# Patient Record
Sex: Female | Born: 1955 | Race: White | Hispanic: Yes | Marital: Married | Smoking: Former smoker
Health system: Southern US, Community
[De-identification: ages and names within clinical notes are randomized; demographics above are authoritative.]

## PROBLEM LIST (undated history)

## (undated) DIAGNOSIS — T7840XA Allergy, unspecified, initial encounter: Secondary | ICD-10-CM

## (undated) DIAGNOSIS — F32A Depression, unspecified: Secondary | ICD-10-CM

## (undated) DIAGNOSIS — D249 Benign neoplasm of unspecified breast: Secondary | ICD-10-CM

## (undated) DIAGNOSIS — N6452 Nipple discharge: Secondary | ICD-10-CM

## (undated) DIAGNOSIS — J302 Other seasonal allergic rhinitis: Secondary | ICD-10-CM

## (undated) DIAGNOSIS — E785 Hyperlipidemia, unspecified: Secondary | ICD-10-CM

## (undated) DIAGNOSIS — L719 Rosacea, unspecified: Secondary | ICD-10-CM

## (undated) DIAGNOSIS — Z789 Other specified health status: Secondary | ICD-10-CM

## (undated) DIAGNOSIS — M1990 Unspecified osteoarthritis, unspecified site: Secondary | ICD-10-CM

## (undated) DIAGNOSIS — N3941 Urge incontinence: Secondary | ICD-10-CM

## (undated) HISTORY — PX: TONSILLECTOMY: SHX5217

## (undated) HISTORY — PX: EYE SURGERY: SHX253

## (undated) HISTORY — DX: Rosacea, unspecified: L71.9

## (undated) HISTORY — PX: BREAST EXCISIONAL BIOPSY: SUR124

## (undated) HISTORY — DX: Hyperlipidemia, unspecified: E78.5

## (undated) HISTORY — DX: Unspecified osteoarthritis, unspecified site: M19.90

## (undated) HISTORY — DX: Urge incontinence: N39.41

## (undated) HISTORY — DX: Benign neoplasm of unspecified breast: D24.9

## (undated) HISTORY — DX: Nipple discharge: N64.52

## (undated) HISTORY — DX: Allergy, unspecified, initial encounter: T78.40XA

## (undated) HISTORY — DX: Depression, unspecified: F32.A

---
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
## 1985-05-22 HISTORY — PX: ARCUATE KERATECTOMY: SHX212

## 1991-05-23 HISTORY — PX: ABDOMINAL HYSTERECTOMY: SHX81

## 1992-05-22 HISTORY — PX: ABDOMINAL HYSTERECTOMY: SHX81

## 2004-05-22 HISTORY — PX: OTHER SURGICAL HISTORY: SHX169

## 2004-12-30 ENCOUNTER — Encounter: Admission: RE | Admit: 2004-12-30 | Discharge: 2004-12-30 | Payer: Self-pay | Admitting: Obstetrics and Gynecology

## 2005-05-03 ENCOUNTER — Ambulatory Visit: Payer: Self-pay | Admitting: Internal Medicine

## 2006-03-02 ENCOUNTER — Encounter: Payer: Self-pay | Admitting: Internal Medicine

## 2006-03-02 ENCOUNTER — Ambulatory Visit: Payer: Self-pay | Admitting: Internal Medicine

## 2006-11-28 ENCOUNTER — Other Ambulatory Visit: Admission: RE | Admit: 2006-11-28 | Discharge: 2006-11-28 | Payer: Self-pay | Admitting: Obstetrics and Gynecology

## 2006-12-12 ENCOUNTER — Encounter: Admission: RE | Admit: 2006-12-12 | Discharge: 2006-12-12 | Payer: Self-pay | Admitting: Obstetrics and Gynecology

## 2007-05-06 ENCOUNTER — Ambulatory Visit: Payer: Self-pay | Admitting: Internal Medicine

## 2007-05-07 ENCOUNTER — Telehealth: Payer: Self-pay | Admitting: Internal Medicine

## 2007-05-07 ENCOUNTER — Encounter: Payer: Self-pay | Admitting: Internal Medicine

## 2008-02-12 ENCOUNTER — Encounter: Admission: RE | Admit: 2008-02-12 | Discharge: 2008-02-12 | Payer: Self-pay | Admitting: Obstetrics and Gynecology

## 2008-02-20 ENCOUNTER — Encounter: Admission: RE | Admit: 2008-02-20 | Discharge: 2008-02-20 | Payer: Self-pay | Admitting: Obstetrics and Gynecology

## 2008-02-20 ENCOUNTER — Encounter (INDEPENDENT_AMBULATORY_CARE_PROVIDER_SITE_OTHER): Payer: Self-pay | Admitting: Diagnostic Radiology

## 2008-11-04 ENCOUNTER — Encounter: Admission: RE | Admit: 2008-11-04 | Discharge: 2008-11-04 | Payer: Self-pay | Admitting: Obstetrics and Gynecology

## 2009-03-02 ENCOUNTER — Ambulatory Visit: Payer: Self-pay | Admitting: Family Medicine

## 2009-03-05 ENCOUNTER — Telehealth: Payer: Self-pay | Admitting: Family Medicine

## 2009-03-05 LAB — CONVERTED CEMR LAB
AST: 24 units/L (ref 0–37)
Albumin: 3.8 g/dL (ref 3.5–5.2)
Alkaline Phosphatase: 68 units/L (ref 39–117)
Basophils Absolute: 0 10*3/uL (ref 0.0–0.1)
Basophils Relative: 0.2 % (ref 0.0–3.0)
Calcium: 9.2 mg/dL (ref 8.4–10.5)
GFR calc non Af Amer: 79.57 mL/min (ref >60)
Hemoglobin: 12.8 g/dL (ref 12.0–15.0)
Lymphocytes Relative: 31.6 % (ref 12.0–46.0)
Monocytes Relative: 2.3 % — ABNORMAL LOW (ref 3.0–12.0)
Neutro Abs: 5.3 10*3/uL (ref 1.4–7.7)
RBC: 4.12 M/uL (ref 3.87–5.11)
Sodium: 140 meq/L (ref 135–145)
Total Protein: 6.6 g/dL (ref 6.0–8.3)
WBC: 8.2 10*3/uL (ref 4.5–10.5)

## 2009-03-22 ENCOUNTER — Other Ambulatory Visit: Admission: RE | Admit: 2009-03-22 | Discharge: 2009-03-22 | Payer: Self-pay | Admitting: Obstetrics and Gynecology

## 2009-09-28 ENCOUNTER — Ambulatory Visit: Payer: Self-pay | Admitting: Internal Medicine

## 2009-12-08 ENCOUNTER — Encounter: Admission: RE | Admit: 2009-12-08 | Discharge: 2009-12-08 | Payer: Self-pay | Admitting: Obstetrics and Gynecology

## 2009-12-23 LAB — HM MAMMOGRAPHY

## 2010-03-10 ENCOUNTER — Ambulatory Visit: Payer: Self-pay | Admitting: Family Medicine

## 2010-03-10 DIAGNOSIS — L259 Unspecified contact dermatitis, unspecified cause: Secondary | ICD-10-CM

## 2010-04-13 ENCOUNTER — Ambulatory Visit: Payer: Self-pay | Admitting: Internal Medicine

## 2010-04-13 LAB — CONVERTED CEMR LAB
Basophils Relative: 0.4 % (ref 0.0–3.0)
Eosinophils Relative: 2 % (ref 0.0–5.0)
Lymphocytes Relative: 42.3 % (ref 12.0–46.0)
MCV: 91.7 fL (ref 78.0–100.0)
Monocytes Relative: 6.5 % (ref 3.0–12.0)
Neutrophils Relative %: 48.8 % (ref 43.0–77.0)
RBC: 4.24 M/uL (ref 3.87–5.11)
WBC: 5.8 10*3/uL (ref 4.5–10.5)

## 2010-04-19 LAB — CONVERTED CEMR LAB
AST: 20 units/L (ref 0–37)
Bilirubin, Direct: 0.1 mg/dL (ref 0.0–0.3)
CO2: 22 meq/L (ref 19–32)
Calcium: 9 mg/dL (ref 8.4–10.5)
Chloride: 105 meq/L (ref 96–112)
FSH: 4.5 milliintl units/mL
Glucose, Bld: 91 mg/dL (ref 70–99)
LDL Cholesterol: 183 mg/dL — ABNORMAL HIGH (ref 0–99)
LH: 11.7 milliintl units/mL
Sodium: 138 meq/L (ref 135–145)
TSH: 1.774 microintl units/mL (ref 0.350–4.500)
Total Bilirubin: 0.9 mg/dL (ref 0.3–1.2)
Total CHOL/HDL Ratio: 5.5

## 2010-05-10 ENCOUNTER — Telehealth: Payer: Self-pay | Admitting: *Deleted

## 2010-05-26 ENCOUNTER — Encounter (INDEPENDENT_AMBULATORY_CARE_PROVIDER_SITE_OTHER): Payer: Self-pay | Admitting: *Deleted

## 2010-06-12 ENCOUNTER — Encounter: Payer: Self-pay | Admitting: Obstetrics and Gynecology

## 2010-06-21 NOTE — Assessment & Plan Note (Signed)
Summary: EAR DISCOMFORT // RS   Vital Signs:  Patient profile:   55 year old female Weight:      190 pounds Temp:     99.6 degrees F oral BP sitting:   120 / 70  (left arm) Cuff size:   regular  Vitals Entered By: Sid Falcon LPN (March 10, 2010 4:08 PM)  History of Present Illness: Bilateral ear pruritus right greater than left for several months. Occasional clear drainage. No associated pain. No purulent drainage. No hearing changes. Has not tried anything topically.  No other rashes.  Allergies: 1)  ! Codeine 2)  ! Sulfa  Past History:  Past Medical History: Last updated: 05/06/2007 Unremarkable  Physical Exam  General:  Well-developed,well-nourished,in no acute distress; alert,appropriate and cooperative throughout examination Ears:  chest some nonspecific dryness and mild erythema mild scaling mostly inferior portion and anterior portion of the ear canal right greater than left. Eardrums appear normal. No evidence for otitis externa Lungs:  Normal respiratory effort, chest expands symmetrically. Lungs are clear to auscultation, no crackles or wheezes. Heart:  Normal rate and regular rhythm. S1 and S2 normal without gallop, murmur, click, rub or other extra sounds. Cervical Nodes:  No lymphadenopathy noted   Impression & Recommendations:  Problem # 1:  ECZEMA (ICD-692.9) Assessment New avoid scratching as much as possible. Her updated medication list for this problem includes:    Elocon 0.1 % Lotn (Mometasone furoate) .Marland Kitchen... Apply to affected rash once daily as needed  Complete Medication List: 1)  Detrol La 2 Mg Cp24 (Tolterodine tartrate) .... Take 1 tablet by mouth once a day 2)  Caltrate 600+d 600-400 Mg-unit Tabs (Calcium carbonate-vitamin d) .... Two times a day 3)  Vitamin C 1000 Mg Tabs (Ascorbic acid) .... Once daily 4)  Aspirin 81 Mg Tabs (Aspirin) .... Once daily 5)  Elocon 0.1 % Lotn (Mometasone furoate) .... Apply to affected rash once daily as  needed  Patient Instructions: 1)  Avoid scratching as much as possible. 2)  Touch base in 2 weeks with primary physician if not improved. Prescriptions: ELOCON 0.1 % LOTN (MOMETASONE FUROATE) apply to affected rash once daily as needed  #1 bottle x 1   Entered and Authorized by:   Evelena Peat MD   Signed by:   Evelena Peat MD on 03/10/2010   Method used:   Electronically to        [id number] (retail)       [address]       [place], [place]  [address]       Ph: [phone] or [phone]       Fax: [phone]   RxID:   [id number]    Orders Added: 1)  Est. Patient Level III [42706]

## 2010-06-21 NOTE — Assessment & Plan Note (Signed)
Summary: cpx----will fast//ccm   Vital Signs:  Patient profile:   55 year old female Height:      66.5 inches Weight:      186 pounds BMI:     29.68 Temp:     99.1 degrees F oral Pulse rate:   84 / minute Pulse rhythm:   regular BP sitting:   114 / 66  (left arm) Cuff size:   large  Vitals Entered By: Alfred Levins, CMA (April 13, 2010 9:08 AM) CC: cpx, fasting, no pap   CC:  cpx, fasting, and no pap.  History of Present Illness: CPX  Preventive Screening-Counseling & Management  Alcohol-Tobacco     Smoking Status: quit  Current Problems (verified): 1)  Health Screening  (ICD-V70.0) 2)  Eczema  (ICD-692.9)  Current Medications (verified): 1)  Detrol La 2 Mg  Cp24 (Tolterodine Tartrate) .... Take 1 Tablet By Mouth Once A Day 2)  Caltrate 600+d 600-400 Mg-Unit  Tabs (Calcium Carbonate-Vitamin D) .... Two Times A Day 3)  Vitamin C 1000 Mg Tabs (Ascorbic Acid) .... Once Daily 4)  Aspirin 81 Mg Tabs (Aspirin) .... Once Daily  Allergies (verified): 1)  ! Codeine 2)  ! Sulfa  Past History:  Past Medical History: g4P3  Family History: mother htn father deceased COPD, MI, AAA---76 yo  Social History: Runner, broadcasting/film/video kindergartin married Former Smoker quit 1990 alcohol---rare 3 kids  Physical Exam  General:  well-developed well-nourished female in no acute distress. HEENT exam atraumatic, normocephalic symmetric her muscles are intact. Neck is supple without lymphadenopathy or thyromegaly. Chest clear to auscultation. Cardiac exam S1-S2 are regular. Abdominal exam at present, soft, nontender, overweight. It is no clubbing cyanosis or edema. Neurologic exam alert and oriented without motor or sensory deicits   Impression & Recommendations:  Problem # 1:  HEALTH SCREENING (ICD-V70.0) health maintenance up-to-date. She has a colonoscopy screening. We will order that. I've advised her to consider weight loss. I've advised her to exercise  regularly. Orders: Venipuncture (18841) TLB-Lipid Panel (80061-LIPID) TLB-BMP (Basic Metabolic Panel-BMET) (80048-METABOL) TLB-CBC Platelet - w/Differential (85025-CBCD) TLB-Hepatic/Liver Function Pnl (80076-HEPATIC) TLB-TSH (Thyroid Stimulating Hormone) (84443-TSH) UA Dipstick w/o Micro (manual) (66063) Gastroenterology Referral (GI)  Complete Medication List: 1)  Detrol La 2 Mg Cp24 (Tolterodine tartrate) .... Take 1 tablet by mouth once a day 2)  Caltrate 600+d 600-400 Mg-unit Tabs (Calcium carbonate-vitamin d) .... Two times a day 3)  Vitamin C 1000 Mg Tabs (Ascorbic acid) .... Once daily 4)  Aspirin 81 Mg Tabs (Aspirin) .... Once daily  Other Orders: TLB-FSH (Follicle Stimulating Hormone) (83001-FSH) TLB-Luteinizing Hormone [facility]) (83002-LH)  Preventive Care Screening  Mammogram:    Date:  12/20/2009    Next Due:  12/2010    Results:  normal   Pap Smear:    Date:  12/20/2009    Next Due:  12/2010    Results:  normal   Last Tetanus Booster:    Date:  05/22/2005    Results:  Tdap     Orders Added: 1)  Venipuncture [36415] 2)  TLB-Lipid Panel [80061-LIPID] 3)  TLB-BMP (Basic Metabolic Panel-BMET) [80048-METABOL] 4)  TLB-CBC Platelet - w/Differential [85025-CBCD] 5)  TLB-Hepatic/Liver Function Pnl [80076-HEPATIC] 6)  TLB-TSH (Thyroid Stimulating Hormone) [84443-TSH] 7)  UA Dipstick w/o Micro (manual) [81002] 8)  Est. Patient 40-64 years [99396] 9)  Gastroenterology Referral [GI] 10)  TLB-FSH (Follicle Stimulating Hormone) [83001-FSH] 11)  TLB-Luteinizing Hormone [facility]) [83002-LH]    Preventive Care Screening  Mammogram:    Date:  12/20/2009  Next Due:  12/2010    Results:  normal   Pap Smear:    Date:  12/20/2009    Next Due:  12/2010    Results:  normal   Last Tetanus Booster:    Date:  05/22/2005    Results:  Tdap    Appended Document: Orders Update     Clinical Lists Changes  Orders: Added new Service order of Specimen Handling (16109)  - Signed Added new Test order of T- * Misc. Laboratory test [phone]) - Signed Observations: Added new observation of COMMENTS: Wynona Canes, CMA  April 13, 2010 1:36 PM  (04/13/2010 9:49) Added new observation of PH URINE: 7.0  (04/13/2010 9:49) Added new observation of SPEC GR URIN: 1.015  (04/13/2010 9:49) Added new observation of APPEARANCE U: Clear  (04/13/2010 9:49) Added new observation of UA COLOR: yellow  (04/13/2010 9:49) Added new observation of WBC DIPSTK U: negative  (04/13/2010 9:49) Added new observation of NITRITE URN: negative  (04/13/2010 9:49) Added new observation of UROBILINOGEN: 0.2  (04/13/2010 9:49) Added new observation of PROTEIN, URN: negative  (04/13/2010 9:49) Added new observation of BLOOD UR DIP: negative  (04/13/2010 9:49) Added new observation of KETONES URN: negative  (04/13/2010 9:49) Added new observation of BILIRUBIN UR: negative  (04/13/2010 9:49) Added new observation of GLUCOSE, URN: negative  (04/13/2010 9:49)      Laboratory Results   Urine Tests  Date/Time Recieved: April 13, 2010 1:36 PM  Date/Time Reported: April 13, 2010 1:36 PM   Routine Urinalysis   Color: yellow Appearance: Clear Glucose: negative   (Normal Range: Negative) Bilirubin: negative   (Normal Range: Negative) Ketone: negative   (Normal Range: Negative) Spec. Gravity: 1.015   (Normal Range: 1.003-1.035) Blood: negative   (Normal Range: Negative) pH: 7.0   (Normal Range: 5.0-8.0) Protein: negative   (Normal Range: Negative) Urobilinogen: 0.2   (Normal Range: 0-1) Nitrite: negative   (Normal Range: Negative) Leukocyte Esterace: negative   (Normal Range: Negative)    Comments: Wynona Canes, CMA  April 13, 2010 1:36 PM

## 2010-06-21 NOTE — Assessment & Plan Note (Signed)
Summary: swollen ankle/rcd   Vital Signs:  Patient profile:   55 year old female Pulse rate:   66 / minute Pulse rhythm:   regular Resp:     12 per minute BP sitting:   110 / 64  (left arm) Cuff size:   regular  Vitals Entered By: Gladis Riffle, RN (Sep 28, 2009 8:59 AM) CC: twisted right ankle last evening--now hurts to walk on that foot--has taken advil and used ice and elevation Is Patient Diabetic? No   CC:  twisted right ankle last evening--now hurts to walk on that foot--has taken advil and used ice and elevation.  History of Present Illness: yesterday twisted ankle internal rotation some pain some swelling put ice and elevated ankle last night.  right ankle  Still has some pain this a.m. with walking  All other systems reviewed and were negative   Preventive Screening-Counseling & Management  Alcohol-Tobacco     Smoking Status: quit  Current Problems (verified): None  Current Medications (verified): 1)  Detrol La 2 Mg  Cp24 (Tolterodine Tartrate) .... Take 1 Tablet By Mouth Once A Day 2)  Caltrate 600+d 600-400 Mg-Unit  Tabs (Calcium Carbonate-Vitamin D) .... Two Times A Day 3)  Vitamin C 1000 Mg Tabs (Ascorbic Acid) .... Once Daily  Allergies: 1)  ! Codeine 2)  ! Sulfa  Physical Exam  General:  Well-developed,well-nourished,in no acute distress; alert,appropriate and cooperative throughout examination Neck:  No deformities, masses, or tenderness noted. Msk:  favors r leg with walking  FROM both ankles, minimal swelling around lateral maleolus right ankle   Impression & Recommendations:  Problem # 1:  ANKLE PAIN (ICD-719.47) acute trauma needs imaging if no fracture then ice and elevation should suffice xray  Orders: T-Ankle Comp Right (73610TC)  Complete Medication List: 1)  Detrol La 2 Mg Cp24 (Tolterodine tartrate) .... Take 1 tablet by mouth once a day 2)  Caltrate 600+d 600-400 Mg-unit Tabs (Calcium carbonate-vitamin d) .... Two times a  day 3)  Vitamin C 1000 Mg Tabs (Ascorbic acid) .... Once daily

## 2010-06-23 NOTE — Letter (Signed)
Summary: Referral - not able to see patient  [facility]  [address] [place], [place] [address]   Phone: [phone]  Fax: [phone]    May 26, 2010   Birdie Sons, [id number]VZ[address] [place], [place] [address]    Re:   Samantha Mayer DOB:  Apr 21, 1956 MRN:   [id number]    Dear Dr. Cato Mulligan:  Thank you for your kind referral of the above patient.  We have attempted to schedule the recommended procedure Screening Colonoscopy but have not been able to schedule because:  ___ The patient was not available by phone and/or has not returned our calls.  X   The patient declined to schedule the procedure at this time.  We appreciate the referral and hope that we will have the opportunity to treat this patient in the future.    Sincerely,    [facility] [phone]

## 2010-06-23 NOTE — Progress Notes (Signed)
Summary: dx question  Phone Note Call from Patient Call back at [phone]   Caller: Patient---triage vm Summary of Call: needing the correct dx. please return her call. Initial call taken by: Warnell Forester,  May 10, 2010 10:49 AM  Follow-up for Phone Call        Correct dx for what? Follow-up by: Trixie Dredge,  May 10, 2010 12:20 PM  Additional Follow-up for Phone Call Additional follow up Details #1::        wrong number Additional Follow-up by: Alfred Levins, CMA,  May 12, 2010 2:59 PM    Additional Follow-up for Phone Call Additional follow up Details #2::    This message was left on triage vm. pt says that she received 2 different dxs about a test or lab. two different calls were made to her. Follow-up by: Warnell Forester,  May 11, 2010 10:16 AM

## 2010-07-29 ENCOUNTER — Telehealth: Payer: Self-pay | Admitting: Internal Medicine

## 2010-07-29 NOTE — Telephone Encounter (Signed)
Triage vm-------please mail her test results from November 2011.

## 2010-07-29 NOTE — Telephone Encounter (Signed)
Labs mailed

## 2010-08-04 ENCOUNTER — Telehealth: Payer: Self-pay | Admitting: Internal Medicine

## 2010-08-04 NOTE — Telephone Encounter (Signed)
Pt called and said that she still has not rcvd the lab results from 03/2010. Pt is aware that results were originally mailed out on 07/29/10, but still not rcvd. Pls resend.

## 2010-08-05 NOTE — Telephone Encounter (Signed)
Mailed labs again

## 2010-10-24 ENCOUNTER — Encounter: Payer: Self-pay | Admitting: Family Medicine

## 2010-10-24 ENCOUNTER — Ambulatory Visit (INDEPENDENT_AMBULATORY_CARE_PROVIDER_SITE_OTHER): Payer: BC Managed Care – PPO | Admitting: Family Medicine

## 2010-10-24 VITALS — BP 140/84 | Temp 99.0°F | Ht 66.75 in | Wt 190.0 lb

## 2010-10-24 DIAGNOSIS — M25539 Pain in unspecified wrist: Secondary | ICD-10-CM

## 2010-10-24 DIAGNOSIS — M7710 Lateral epicondylitis, unspecified elbow: Secondary | ICD-10-CM

## 2010-10-24 NOTE — Patient Instructions (Signed)
Ice elbow several times daily. Use wrist splint again, esp at night for the next few weeks. Reduce knitting activity. Consider tennis elbow strap.

## 2010-10-24 NOTE — Progress Notes (Signed)
  Subjective:    Patient ID: Samantha Mayer, female    DOB: Apr 29, 1956, 55 y.o.   MRN: [id number]  HPI Patient symptoms left elbow pain for approximately 2 months. She is right-hand dominant. No specific injury. Does a lot of knitting.  Pain is relatively constant. No alleviating factors thus far. Has not tried icing. She also has some bilateral wrist pain mostly ulnar aspect. She has not noted any redness or obvious swelling.   Review of Systems  Constitutional: Negative for fever, chills, fatigue and unexpected weight change.  Musculoskeletal: Negative for myalgias.  Skin: Negative for rash.  Hematological: Negative for adenopathy. Does not bruise/bleed easily.       Objective:   Physical Exam  Constitutional: She appears well-developed and well-nourished.  Cardiovascular: Normal rate and regular rhythm.   Pulmonary/Chest: Breath sounds normal. No respiratory distress. She has no wheezes. She has no rales.  Musculoskeletal:       Left elbow reveals full range of motion. No warmth erythema. No olecranon effusion. No pain with supination or pronation. She has pain with extension of wrist against resistance but not with wrist flexion. She has a non-specific tenderness on the left and right ulnar collateral ligament. Good range of motion both wrists. No signs of objective inflammatory arthritis.          Assessment & Plan:  Left lateral epicondylitis. Recommend icing 2-3 times daily. Avoid gripping activities. Reduced knitting activities over the next few weeks. Consider tennis elbow strap. Discussed risks and benefits of corticosteroid injection patient consented.  Left elbow laterally over the extensor carpi radialis brevis tendon with Betadine. Using sterile technique injected 40 mg Depo-Medrol and 1 cc of plain Xylocaine using 25-gauge 5/8 inch needle. Patient tolerated well Bilateral wrist pain. Tendinitis versus collateral ligament strain. Start back wrist splints

## 2010-11-29 ENCOUNTER — Other Ambulatory Visit: Payer: Self-pay | Admitting: Obstetrics and Gynecology

## 2010-11-29 DIAGNOSIS — Z1231 Encounter for screening mammogram for malignant neoplasm of breast: Secondary | ICD-10-CM

## 2010-12-15 ENCOUNTER — Ambulatory Visit: Payer: BC Managed Care – PPO

## 2010-12-19 ENCOUNTER — Ambulatory Visit
Admission: RE | Admit: 2010-12-19 | Discharge: 2010-12-19 | Disposition: A | Discharge status: Home / Self Care | Payer: BC Managed Care – PPO | Source: Ambulatory Visit | Attending: Obstetrics and Gynecology | Admitting: Obstetrics and Gynecology

## 2010-12-19 ENCOUNTER — Other Ambulatory Visit: Payer: Self-pay | Admitting: Obstetrics and Gynecology

## 2010-12-19 DIAGNOSIS — Z1231 Encounter for screening mammogram for malignant neoplasm of breast: Secondary | ICD-10-CM

## 2010-12-19 DIAGNOSIS — N6452 Nipple discharge: Secondary | ICD-10-CM

## 2010-12-23 LAB — HM PAP SMEAR: HM Pap smear: NEGATIVE

## 2010-12-23 LAB — HM MAMMOGRAPHY: HM Mammogram: NORMAL

## 2010-12-27 ENCOUNTER — Ambulatory Visit
Admission: RE | Admit: 2010-12-27 | Discharge: 2010-12-27 | Disposition: A | Discharge status: Home / Self Care | Payer: BC Managed Care – PPO | Source: Ambulatory Visit | Attending: Obstetrics and Gynecology | Admitting: Obstetrics and Gynecology

## 2010-12-27 DIAGNOSIS — N6452 Nipple discharge: Secondary | ICD-10-CM

## 2011-01-11 ENCOUNTER — Ambulatory Visit (INDEPENDENT_AMBULATORY_CARE_PROVIDER_SITE_OTHER): Payer: BC Managed Care – PPO | Admitting: Surgery

## 2011-01-11 ENCOUNTER — Encounter (INDEPENDENT_AMBULATORY_CARE_PROVIDER_SITE_OTHER): Payer: Self-pay | Admitting: Surgery

## 2011-01-11 VITALS — BP 130/84 | HR 72 | Temp 98.3°F | Ht 67.0 in | Wt 186.4 lb

## 2011-01-11 DIAGNOSIS — D249 Benign neoplasm of unspecified breast: Secondary | ICD-10-CM

## 2011-01-11 NOTE — Progress Notes (Signed)
Chief complaint: Nipple discharge and mass  History of present illness: The patient presents from the breast center with a bloody nipple discharge and a small growth protruding from the tip of the left nipple.  The patient notes that her gynecologist trimmed off a small intraductal papilloma from this same nipple in 2009. Recently she developed again a bloody nipple discharge and when she was seen at the breast center they noted a small growths coming from a medially placed duct and the left nipple. The patient notes that when she had this previously there was a quite visible growth coming from the nipple in the same area. She's had no other breast symptoms.  She does have an aunt who had breast cancer.  Past history family history social history et Karie Soda are all reviewed in her Epic chart and not redictated into this note.  Physical exam  Breasts: The breasts are overall symmetric in size and shape. There is a slight inversion of the right nipple but no evidence of any discharge. The left nipple shows a tiny pink gross coming from a medial duct. Grossly this appears to be a papilloma.  Data reviewed: I looked over the mammogram reports et Karie Soda.  Impression: Recurrent intraductal papilloma  Plan: I discussed excision with the patient. This would require an anesthetic and possibly partial removal of the nipple. It could be that the papilloma is actually originating from a little deeper in the duct and just protruding out the tip of the nipple. It was apparently benign on prior biopsy.  After discussion of the alternatives the patient we decided that we would try to do a small biopsy of this today. I was able to get a tiny fragment of this taken off with scissors and we'll send to pathology.  Once the pathology report is back we can make further plans. Even if this is benign, I would recommend that she have ductal excision because of the concern for subsequent development of DCIS.  Unfortunately, as such an excision might require removal of the nipple.

## 2011-01-11 NOTE — Patient Instructions (Signed)
We will call your report from the small tissue from the nipple we biopsied today

## 2011-01-16 ENCOUNTER — Telehealth (INDEPENDENT_AMBULATORY_CARE_PROVIDER_SITE_OTHER): Payer: Self-pay | Admitting: Surgery

## 2011-01-16 NOTE — Progress Notes (Signed)
Quick Note:  Tell her path is benign and as expected. There was evidence of acute inflammation, but nothing to suggest cancer. We should follow-up in about three months ______

## 2011-01-16 NOTE — Telephone Encounter (Signed)
Patient made aware path results are not yet back and we will contact her when we get the results.

## 2011-01-17 ENCOUNTER — Telehealth (INDEPENDENT_AMBULATORY_CARE_PROVIDER_SITE_OTHER): Payer: Self-pay | Admitting: General Surgery

## 2011-01-17 NOTE — Telephone Encounter (Signed)
Patient aware path is benign. She will call back with any problems or follow up with [place] in 3 months.

## 2011-01-17 NOTE — Telephone Encounter (Signed)
Message copied by Liliana Cline on Tue Jan 17, 2011 11:10 AM ------      Message from: Currie Paris      Created: Mon Jan 16, 2011  6:38 PM       Tell her path is benign and as expected. There was evidence of acute inflammation, but nothing to suggest cancer. We should follow-up in about three months

## 2011-02-28 ENCOUNTER — Telehealth: Payer: Self-pay | Admitting: Internal Medicine

## 2011-02-28 NOTE — Telephone Encounter (Signed)
ok 

## 2011-02-28 NOTE — Telephone Encounter (Signed)
Pt and he husband would like to be scheduled for a physical. Pt physical was 04/13/10 of last year i explained to pt it would have to be scheduled at least a year and a day after that for insurance purposes. 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 Pt requested for both her and her husband to see a diff doctor for their physical. Can they see a diff doctor?Marland Kitchen. Please contact pt

## 2011-03-03 NOTE — Telephone Encounter (Signed)
Pt called and was req status of getting sch for afternoon cpx with diff dr. Rock Nephew has been informed that Dr Cato Mulligan approved their req. Pt and spouse have both been sch for cpx with Dr Caryl Never for 12/3 at 2pm and 3pm, as noted.

## 2011-04-14 ENCOUNTER — Other Ambulatory Visit (INDEPENDENT_AMBULATORY_CARE_PROVIDER_SITE_OTHER): Payer: BC Managed Care – PPO

## 2011-04-14 DIAGNOSIS — Z0000 Encounter for general adult medical examination without abnormal findings: Secondary | ICD-10-CM

## 2011-04-14 LAB — HEPATIC FUNCTION PANEL
AST: 26 U/L (ref 0–37)
Albumin: 3.8 g/dL (ref 3.5–5.2)
Alkaline Phosphatase: 89 U/L (ref 39–117)
Total Protein: 6.7 g/dL (ref 6.0–8.3)

## 2011-04-14 LAB — CBC WITH DIFFERENTIAL/PLATELET
Basophils Absolute: 0 10*3/uL (ref 0.0–0.1)
Basophils Relative: 0.4 % (ref 0.0–3.0)
Eosinophils Absolute: 0.1 10*3/uL (ref 0.0–0.7)
HCT: 38.4 % (ref 36.0–46.0)
Hemoglobin: 13 g/dL (ref 12.0–15.0)
Lymphs Abs: 2.3 10*3/uL (ref 0.7–4.0)
MCHC: 33.8 g/dL (ref 30.0–36.0)
Monocytes Relative: 5.9 % (ref 3.0–12.0)
Neutro Abs: 3.1 10*3/uL (ref 1.4–7.7)
RBC: 4.16 Mil/uL (ref 3.87–5.11)
RDW: 12.6 % (ref 11.5–14.6)

## 2011-04-14 LAB — POCT URINALYSIS DIPSTICK
Glucose, UA: NEGATIVE
Ketones, UA: NEGATIVE
Spec Grav, UA: 1.01
Urobilinogen, UA: 0.2

## 2011-04-14 LAB — BASIC METABOLIC PANEL
CO2: 25 mEq/L (ref 19–32)
Chloride: 107 mEq/L (ref 96–112)
Glucose, Bld: 90 mg/dL (ref 70–99)
Potassium: 4 mEq/L (ref 3.5–5.1)
Sodium: 139 mEq/L (ref 135–145)

## 2011-04-14 LAB — TSH: TSH: 1.4 u[IU]/mL (ref 0.35–5.50)

## 2011-04-14 LAB — LIPID PANEL
Cholesterol: 216 mg/dL — ABNORMAL HIGH (ref 0–200)
VLDL: 21 mg/dL (ref 0.0–40.0)

## 2011-04-14 LAB — LDL CHOLESTEROL, DIRECT: Direct LDL: 169.7 mg/dL

## 2011-04-24 ENCOUNTER — Encounter: Payer: Self-pay | Admitting: Family Medicine

## 2011-04-24 ENCOUNTER — Ambulatory Visit (INDEPENDENT_AMBULATORY_CARE_PROVIDER_SITE_OTHER): Payer: BC Managed Care – PPO | Admitting: Family Medicine

## 2011-04-24 ENCOUNTER — Telehealth: Payer: Self-pay | Admitting: *Deleted

## 2011-04-24 VITALS — BP 130/80 | HR 88 | Temp 98.7°F | Resp 12 | Ht 66.75 in | Wt 186.0 lb

## 2011-04-24 DIAGNOSIS — Z0000 Encounter for general adult medical examination without abnormal findings: Secondary | ICD-10-CM

## 2011-04-24 NOTE — Progress Notes (Signed)
  Subjective:    Patient ID: Samantha Mayer, female    DOB: 1956-02-23, 55 y.o.   MRN: [id number]  HPI  Patient seen for complete physical. She sees a gynecologist for Pap smears. Past medical history significant for urine urgency treated with Detrol LA. She takes several supplements. History of mild hyperlipidemia. Has received Pap smear and mammogram earlier this year. No prior history of screening colonoscopy. Flu vaccine earlier at work.  No consistent exercise. Weight stable. Remote history of smoking.  Past Medical History  Diagnosis Date  . Nipple discharge     bloody in lft  . Papilloma of breast     lft  . Hearing loss   . Urgency incontinence    Past Surgical History  Procedure Date  . Tonsillectomy   . Arcuate keratectomy 1987  . Breast papilloma excision   . Abdominal hysterectomy 1993    Leiomyomas    reports that she has quit smoking. She does not have any smokeless tobacco history on file. She reports that she drinks alcohol. She reports that she does not use illicit drugs. family history includes COPD in her father; Heart disease in her father; and Hypertension in her mother. Allergies  Allergen Reactions  . Codeine     REACTION: nausea  . Sulfonamide Derivatives     REACTION: rash      Review of Systems  Constitutional: Negative for fever, activity change, appetite change, fatigue and unexpected weight change.  HENT: Negative for hearing loss, ear pain, sore throat and trouble swallowing.   Eyes: Negative for visual disturbance.  Respiratory: Negative for cough and shortness of breath.   Cardiovascular: Negative for chest pain and palpitations.  Gastrointestinal: Negative for abdominal pain, diarrhea, constipation and blood in stool.  Genitourinary: Negative for dysuria and hematuria.  Musculoskeletal: Negative for myalgias, back pain and arthralgias.  Skin: Negative for rash.  Neurological: Negative for dizziness, syncope and headaches.    Hematological: Negative for adenopathy.  Psychiatric/Behavioral: Negative for confusion and dysphoric mood.       Objective:   Physical Exam  Constitutional: She is oriented to person, place, and time. She appears well-developed and well-nourished.  HENT:  Head: Normocephalic and atraumatic.  Eyes: EOM are normal. Pupils are equal, round, and reactive to light.  Neck: Normal range of motion. Neck supple. No thyromegaly present.  Cardiovascular: Normal rate, regular rhythm and normal heart sounds.   No murmur heard. Pulmonary/Chest: Breath sounds normal. No respiratory distress. She has no wheezes. She has no rales.  Abdominal: Soft. Bowel sounds are normal. She exhibits no distension and no mass. There is no tenderness. There is no rebound and no guarding.  Genitourinary:       As per GYN  Musculoskeletal: Normal range of motion. She exhibits no edema.  Lymphadenopathy:    She has no cervical adenopathy.  Neurological: She is alert and oriented to person, place, and time. She displays normal reflexes. No cranial nerve deficit.  Skin: No rash noted.  Psychiatric: She has a normal mood and affect. Her behavior is normal. Judgment and thought content normal.          Assessment & Plan:  Complete physical. Recommend colonoscopy but patient undecided. Does agree to Hemoccult cards. Immunizations up to date. Labs reviewed with patient. Elevated lipids. Recommend more consistent exercise and weight loss and reassess lipids 6 months

## 2011-04-24 NOTE — Telephone Encounter (Signed)
Pt here for CPX, requesting transfer of care to Dr Caryl Never, scheduling concerns only.

## 2011-04-24 NOTE — Patient Instructions (Signed)
Establish more regular exercise. Try to lose some weight. Reduce saturated fats. Consider repeat lipids in about 6 months. Complete hemoccult cards

## 2011-04-25 NOTE — Telephone Encounter (Signed)
ok 

## 2011-04-25 NOTE — Telephone Encounter (Signed)
Ok with me 

## 2011-04-25 NOTE — Telephone Encounter (Signed)
Please change provider to Dr Caryl Never.  Thank you

## 2011-04-25 NOTE — Telephone Encounter (Signed)
[facility] changed to Dr  Caryl Never.

## 2011-06-28 ENCOUNTER — Ambulatory Visit (INDEPENDENT_AMBULATORY_CARE_PROVIDER_SITE_OTHER): Payer: BC Managed Care – PPO | Admitting: Surgery

## 2011-06-28 ENCOUNTER — Encounter (INDEPENDENT_AMBULATORY_CARE_PROVIDER_SITE_OTHER): Payer: Self-pay | Admitting: Surgery

## 2011-06-28 VITALS — BP 132/88 | HR 80 | Temp 98.0°F | Resp 16 | Ht 67.0 in | Wt 190.2 lb

## 2011-06-28 DIAGNOSIS — D249 Benign neoplasm of unspecified breast: Secondary | ICD-10-CM

## 2011-06-28 NOTE — Progress Notes (Signed)
Chief complaint: Nipple discharge  History of present illness: This patient was seen by me several months ago with a small bit of tissue protruding from the left nipple and some drainage. I was able to biopsy this tissue and it appeared to be inflammatory. However she continued to have spontaneous clear nipple discharge from a single duct in the 11:00 position on the left. She is concerned and will like a surgical biopsy done.  Past history, family history, and review of systems are basically unchanged from her prior visit.  Exan: Vs: BP 132/88  Pulse 80  Temp(Src) 98 F (36.7 C) (Temporal)  Resp 16  Ht 5\' 7"  (1.702 m)  Wt 190 lb 3.2 oz (86.274 kg)  BMI 29.79 kg/m2 GENERAL:  The patient is alert, oriented, and generally healthy-appearing, NAD. Mood and affect are normal.  HEENT:  The head is normocephalic, the eyes nonicteric, the pupils were round regular and equal. EOMs are normal. Pharynx normal. Dentition good.  NECK:  The neck is supple and there are no masses or thyromegaly.  LUNGS: Normal respirations and clear to auscultation.  HEART: Regular rhythm, with no murmurs rubs or gallops. Pulses are intact carotid dorsalis pedis and posterior tibial. No significant varicosities are noted.  BREASTS:  The right breast is completely normal. The left breast is normal to both inspection and palpation. However with gentle manipulation there is a clear yellow discharge from a duct at the 11:00 position. I did not appreciate any mass underneath this. There is no tenderness.  ABDOMEN: Soft, flat, and nontender. No masses or organomegaly is noted. No hernias are noted. Bowel sounds are normal.  EXTREMITIES:  Good range of motion, no edema.  IMPRESSION: Nipple discharge left  PLAN: discussed alternatives with patient and she would like to proceed to ductal excision.  Will schedule.

## 2011-06-28 NOTE — Patient Instructions (Addendum)
We will schedule outpatient surgery to remove the ductal tissue from underneath the left  nipple area which is producing the nipple discharge

## 2011-06-29 ENCOUNTER — Telehealth (INDEPENDENT_AMBULATORY_CARE_PROVIDER_SITE_OTHER): Payer: Self-pay | Admitting: General Surgery

## 2011-06-29 NOTE — Telephone Encounter (Signed)
Message copied by Liliana Cline on Thu Jun 29, 2011  1:46 PM ------      Message from: Currie Paris      Created: Thu Jun 29, 2011 11:44 AM      Regarding: FW: pre op appointment       Will need preop appointment      ----- Message -----         From: Docia Chuck         Sent: 06/29/2011  11:29 AM           To: Currie Paris, MD, Liliana Cline, CMA      Subject: pre op appointment                                       Surgery is not until 09/15/11 as pt only wanted a Friday and could not do March date.

## 2011-06-29 NOTE — Telephone Encounter (Signed)
Called and left message for patient to call back and make preop appt about 2 weeks prior to surgery.

## 2011-09-05 ENCOUNTER — Encounter (INDEPENDENT_AMBULATORY_CARE_PROVIDER_SITE_OTHER): Payer: Self-pay | Admitting: Surgery

## 2011-09-05 ENCOUNTER — Ambulatory Visit (INDEPENDENT_AMBULATORY_CARE_PROVIDER_SITE_OTHER): Payer: BC Managed Care – PPO | Admitting: Surgery

## 2011-09-05 VITALS — BP 136/76 | HR 70 | Temp 96.9°F | Resp 18 | Ht 67.0 in | Wt 187.4 lb

## 2011-09-05 DIAGNOSIS — D249 Benign neoplasm of unspecified breast: Secondary | ICD-10-CM

## 2011-09-05 NOTE — Progress Notes (Signed)
Chief complaint: Nipple discharge  History of present illness: This patient was seen by me several months ago with a small bit of tissue protruding from the left nipple and some drainage. I was able to biopsy this tissue and it appeared to be inflammatory. However she continued to have spontaneous clear nipple discharge from a single duct in the 11:00 position on the left. She came to review pre-op plans. No issues since last visit  Past history, family history, and review of systems are basically unchanged from her prior visit.  Exan: Vs: BP 136/76  Pulse 70  Temp(Src) 96.9 F (36.1 C) (Temporal)  Resp 18  Ht 5\' 7"  (1.702 m)  Wt 187 lb 6 oz (84.993 kg)  BMI 29.35 kg/m2 GENERAL:  The patient is alert, oriented, and generally healthy-appearing, NAD. Mood and affect are normal.  HEENT:  The head is normocephalic, the eyes nonicteric, the pupils were round regular and equal. EOMs are normal. Pharynx normal. Dentition good.  NECK:  The neck is supple and there are no masses or thyromegaly.  LUNGS: Normal respirations and clear to auscultation.  HEART: Regular rhythm, with no murmurs rubs or gallops. Pulses are intact carotid dorsalis pedis and posterior tibial. No significant varicosities are noted.  BREASTS:  The right breast is completely normal. The left breast is normal to both inspection and palpation. However with gentle manipulation there is a clear yellow discharge from a duct at the 11:00 position. I did not appreciate any mass underneath this. There is no tenderness.  ABDOMEN: Soft, flat, and nontender. No masses or organomegaly is noted. No hernias are noted. Bowel sounds are normal.  EXTREMITIES:  Good range of motion, no edema.  IMPRESSION: Nipple discharge left  PLAN: discussed alternatives with patient and she would like to proceed to ductal excision.  Will schedule.

## 2011-09-12 ENCOUNTER — Encounter (HOSPITAL_BASED_OUTPATIENT_CLINIC_OR_DEPARTMENT_OTHER): Payer: Self-pay | Admitting: *Deleted

## 2011-09-12 NOTE — Progress Notes (Signed)
No labs needed Has a cold-allergies-taking mussinex-allergy-sinus meds-told her if cold goes to chest or runs fever-she needs to call dr Weldon Inches office asap

## 2011-09-13 ENCOUNTER — Telehealth (INDEPENDENT_AMBULATORY_CARE_PROVIDER_SITE_OTHER): Payer: Self-pay | Admitting: General Surgery

## 2011-09-13 ENCOUNTER — Encounter: Payer: Self-pay | Admitting: Family Medicine

## 2011-09-13 ENCOUNTER — Ambulatory Visit (INDEPENDENT_AMBULATORY_CARE_PROVIDER_SITE_OTHER): Payer: BC Managed Care – PPO | Admitting: Family Medicine

## 2011-09-13 VITALS — BP 110/74 | Temp 98.7°F | Wt 186.0 lb

## 2011-09-13 DIAGNOSIS — R05 Cough: Secondary | ICD-10-CM

## 2011-09-13 MED ORDER — AZITHROMYCIN 250 MG PO TABS: ORAL_TABLET | ORAL | Status: AC

## 2011-09-13 NOTE — Telephone Encounter (Signed)
Would be best to reschedule her surgery for later in May to allow full recovery

## 2011-09-13 NOTE — Telephone Encounter (Signed)
Spoke with patient. She has seen her PCP and is being put on antibiotics. Aware Dr Jamey Ripa wants to delay surgery. Our scheduling department will call her to set up a new date.

## 2011-09-13 NOTE — Telephone Encounter (Signed)
Pt calling to report she has breast surgery scheduled on Friday, 09/15/11, and woke today with temperature 101 F with chest congestion.  Advised her to contact/ see her PCP and I will notify Dr. Jamey Ripa.  She understands and is reachable on her cell phone first.

## 2011-09-13 NOTE — Progress Notes (Signed)
  Subjective:    Patient ID: Samantha Mayer, female    DOB: 1955/08/14, 56 y.o.   MRN: [id number]  HPI  Acute visit. Onset last Friday of cough and chest congestion. She had sore throat initially which is now resolved. She developed fever yesterday and this morning temperature of 101.4. Intermittent headache. No stiff neck. She is concerned because of Friday having breast surgery. She has not had any nausea, vomiting, or diarrhea. Using over-the-counter Mucinex. Ex-smoker. Denies dyspnea. No hemoptysis.  Review of Systems As per history of present illness    Objective:   Physical Exam  Constitutional: She appears well-developed and well-nourished.  HENT:  Right Ear: External ear normal.  Left Ear: External ear normal.  Mouth/Throat: Oropharynx is clear and moist.  Neck: Neck supple.  Cardiovascular: Normal rate and regular rhythm.   Pulmonary/Chest: Effort normal and breath sounds normal. No respiratory distress. She has no wheezes. She has no rales.  Musculoskeletal: She exhibits no edema.  Lymphadenopathy:    She has no cervical adenopathy.          Assessment & Plan:  Productive cough. She does not have clinical evidence for pneumonia this time but concerning is the fact she developed fever several days into her illness. Start Zithromax. Chest x-ray for further evaluation not promptly improving.

## 2011-09-18 ENCOUNTER — Encounter (HOSPITAL_BASED_OUTPATIENT_CLINIC_OR_DEPARTMENT_OTHER): Admission: RE | Payer: Self-pay | Source: Ambulatory Visit

## 2011-09-18 ENCOUNTER — Ambulatory Visit (HOSPITAL_BASED_OUTPATIENT_CLINIC_OR_DEPARTMENT_OTHER): Admission: RE | Admit: 2011-09-18 | Payer: BC Managed Care – PPO | Source: Ambulatory Visit | Admitting: Surgery

## 2011-09-18 HISTORY — DX: Other specified health status: Z78.9

## 2011-09-18 HISTORY — DX: Other seasonal allergic rhinitis: J30.2

## 2011-09-18 SURGERY — EXCISION DUCTAL SYSTEM BREAST
Anesthesia: General | Laterality: Left

## 2011-10-25 NOTE — Progress Notes (Signed)
surg was r/s due to a URI-better now

## 2011-10-27 ENCOUNTER — Ambulatory Visit (HOSPITAL_BASED_OUTPATIENT_CLINIC_OR_DEPARTMENT_OTHER): Payer: BC Managed Care – PPO | Admitting: Certified Registered Nurse Anesthetist

## 2011-10-27 ENCOUNTER — Ambulatory Visit (HOSPITAL_BASED_OUTPATIENT_CLINIC_OR_DEPARTMENT_OTHER)
Admission: RE | Admit: 2011-10-27 | Discharge: 2011-10-27 | Disposition: A | Discharge status: Home / Self Care | Payer: BC Managed Care – PPO | Source: Ambulatory Visit | Attending: Surgery | Admitting: Surgery

## 2011-10-27 ENCOUNTER — Encounter (HOSPITAL_BASED_OUTPATIENT_CLINIC_OR_DEPARTMENT_OTHER): Payer: Self-pay | Admitting: Certified Registered"

## 2011-10-27 ENCOUNTER — Encounter (HOSPITAL_BASED_OUTPATIENT_CLINIC_OR_DEPARTMENT_OTHER)
Admission: RE | Disposition: A | Discharge status: Home / Self Care | Payer: Self-pay | Source: Ambulatory Visit | Attending: Surgery

## 2011-10-27 ENCOUNTER — Encounter (HOSPITAL_BASED_OUTPATIENT_CLINIC_OR_DEPARTMENT_OTHER): Payer: Self-pay | Admitting: Certified Registered Nurse Anesthetist

## 2011-10-27 ENCOUNTER — Encounter (HOSPITAL_BASED_OUTPATIENT_CLINIC_OR_DEPARTMENT_OTHER): Payer: Self-pay | Admitting: *Deleted

## 2011-10-27 DIAGNOSIS — D249 Benign neoplasm of unspecified breast: Secondary | ICD-10-CM | POA: Insufficient documentation

## 2011-10-27 HISTORY — PX: BREAST DUCTAL SYSTEM EXCISION: SHX5242

## 2011-10-27 SURGERY — EXCISION DUCTAL SYSTEM BREAST
Anesthesia: General | Site: Breast | Laterality: Left | Wound class: Clean

## 2011-10-27 MED ORDER — PROPOFOL 10 MG/ML IV EMUL
INTRAVENOUS | Status: DC | PRN
  Administered 2011-10-27: 250 mg via INTRAVENOUS

## 2011-10-27 MED ORDER — HYDROMORPHONE HCL 2 MG PO TABS
2.0000 mg | ORAL_TABLET | Freq: Once | ORAL | Status: AC
  Administered 2011-10-27: 2 mg via ORAL

## 2011-10-27 MED ORDER — ONDANSETRON HCL 4 MG/2ML IJ SOLN
INTRAMUSCULAR | Status: DC | PRN
  Administered 2011-10-27: 4 mg via INTRAVENOUS

## 2011-10-27 MED ORDER — ACETAMINOPHEN 10 MG/ML IV SOLN
1000.0000 mg | Freq: Once | INTRAVENOUS | Status: AC
  Administered 2011-10-27: 1000 mg via INTRAVENOUS

## 2011-10-27 MED ORDER — FENTANYL CITRATE 0.05 MG/ML IJ SOLN
INTRAMUSCULAR | Status: DC | PRN
  Administered 2011-10-27: 100 ug via INTRAVENOUS

## 2011-10-27 MED ORDER — HYDROMORPHONE HCL 2 MG PO TABS
2.0000 mg | ORAL_TABLET | ORAL | Status: AC | PRN
Start: 2011-10-27 — End: 2011-11-06

## 2011-10-27 MED ORDER — MIDAZOLAM HCL 5 MG/5ML IJ SOLN
INTRAMUSCULAR | Status: DC | PRN
  Administered 2011-10-27: 2 mg via INTRAVENOUS

## 2011-10-27 MED ORDER — BUPIVACAINE HCL (PF) 0.25 % IJ SOLN
INTRAMUSCULAR | Status: DC | PRN
  Administered 2011-10-27: 20 mL

## 2011-10-27 MED ORDER — DROPERIDOL 2.5 MG/ML IJ SOLN
INTRAMUSCULAR | Status: DC | PRN
  Administered 2011-10-27: 0.625 mg via INTRAVENOUS

## 2011-10-27 MED ORDER — LIDOCAINE HCL (CARDIAC) 20 MG/ML IV SOLN
INTRAVENOUS | Status: DC | PRN
  Administered 2011-10-27: 100 mg via INTRAVENOUS

## 2011-10-27 MED ORDER — OXYCODONE HCL 5 MG PO TABS: 5.0000 mg | ORAL_TABLET | Freq: Once | ORAL | Status: DC | PRN

## 2011-10-27 MED ORDER — FENTANYL CITRATE 0.05 MG/ML IJ SOLN
25.0000 ug | INTRAMUSCULAR | Status: DC | PRN
  Administered 2011-10-27 (×2): 25 ug via INTRAVENOUS

## 2011-10-27 MED ORDER — LACTATED RINGERS IV SOLN
INTRAVENOUS | Status: DC
  Administered 2011-10-27: 13:00:00 via INTRAVENOUS

## 2011-10-27 MED ORDER — DEXAMETHASONE SODIUM PHOSPHATE 4 MG/ML IJ SOLN
INTRAMUSCULAR | Status: DC | PRN
  Administered 2011-10-27: 4 mg via INTRAVENOUS

## 2011-10-27 MED ORDER — METOCLOPRAMIDE HCL 5 MG/ML IJ SOLN: 10.0000 mg | Freq: Once | INTRAMUSCULAR | Status: DC | PRN

## 2011-10-27 SURGICAL SUPPLY — 50 items
ADH SKN CLS APL DERMABOND .7 (GAUZE/BANDAGES/DRESSINGS) ×1
APPLICATOR COTTON TIP 6IN STRL (MISCELLANEOUS) IMPLANT
BANDAGE ELASTIC 6 VELCRO ST LF (GAUZE/BANDAGES/DRESSINGS) IMPLANT
BLADE HEX COATED 2.75 (ELECTRODE) ×2 IMPLANT
BLADE SURG 15 STRL LF DISP TIS (BLADE) ×1 IMPLANT
BLADE SURG 15 STRL SS (BLADE) ×2
BNDG COHESIVE 6X5 TAN STRL LF (GAUZE/BANDAGES/DRESSINGS) IMPLANT
CANISTER SUCTION 1200CC (MISCELLANEOUS) ×2 IMPLANT
CHLORAPREP W/TINT 26ML (MISCELLANEOUS) ×2 IMPLANT
CLIP TI MEDIUM 6 (CLIP) IMPLANT
CLIP TI WIDE RED SMALL 6 (CLIP) IMPLANT
CLOTH BEACON ORANGE TIMEOUT ST (SAFETY) ×2 IMPLANT
COVER MAYO STAND STRL (DRAPES) ×2 IMPLANT
COVER TABLE BACK 60X90 (DRAPES) ×2 IMPLANT
DECANTER SPIKE VIAL GLASS SM (MISCELLANEOUS) IMPLANT
DERMABOND ADVANCED (GAUZE/BANDAGES/DRESSINGS) ×1
DERMABOND ADVANCED .7 DNX12 (GAUZE/BANDAGES/DRESSINGS) ×1 IMPLANT
DRAPE LAPAROTOMY TRNSV 102X78 (DRAPE) ×2 IMPLANT
DRAPE UTILITY XL STRL (DRAPES) ×2 IMPLANT
ELECT REM PT RETURN 9FT ADLT (ELECTROSURGICAL) ×2
ELECTRODE REM PT RTRN 9FT ADLT (ELECTROSURGICAL) ×1 IMPLANT
GLOVE BIO SURGEON STRL SZ7.5 (GLOVE) ×1 IMPLANT
GLOVE BIOGEL PI IND STRL 8 (GLOVE) IMPLANT
GLOVE BIOGEL PI INDICATOR 8 (GLOVE) ×1
GLOVE EUDERMIC 7 POWDERFREE (GLOVE) ×2 IMPLANT
GOWN PREVENTION PLUS XLARGE (GOWN DISPOSABLE) ×3 IMPLANT
GOWN PREVENTION PLUS XXLARGE (GOWN DISPOSABLE) ×1 IMPLANT
NDL HYPO 25X1 1.5 SAFETY (NEEDLE) ×1 IMPLANT
NDL SAFETY ECLIPSE 18X1.5 (NEEDLE) IMPLANT
NEEDLE HYPO 18GX1.5 SHARP (NEEDLE)
NEEDLE HYPO 25X1 1.5 SAFETY (NEEDLE) ×2 IMPLANT
NS IRRIG 1000ML POUR BTL (IV SOLUTION) IMPLANT
PACK BASIN DAY SURGERY FS (CUSTOM PROCEDURE TRAY) ×2 IMPLANT
PENCIL BUTTON HOLSTER BLD 10FT (ELECTRODE) ×2 IMPLANT
SLEEVE SCD COMPRESS KNEE MED (MISCELLANEOUS) ×2 IMPLANT
SPONGE GAUZE 4X4 12PLY (GAUZE/BANDAGES/DRESSINGS) IMPLANT
SPONGE INTESTINAL PEANUT (DISPOSABLE) IMPLANT
SPONGE LAP 4X18 X RAY DECT (DISPOSABLE) ×2 IMPLANT
STAPLER VISISTAT 35W (STAPLE) IMPLANT
SUT MNCRL AB 4-0 PS2 18 (SUTURE) ×2 IMPLANT
SUT SILK 0 TIES 10X30 (SUTURE) IMPLANT
SUT VIC AB 3-0 CT1 27 (SUTURE)
SUT VIC AB 3-0 CT1 27XBRD (SUTURE) IMPLANT
SUT VICRYL 3-0 CR8 SH (SUTURE) ×2 IMPLANT
SYR CONTROL 10ML LL (SYRINGE) ×2 IMPLANT
SYR TB 1ML 25GX5/8 (SYRINGE) IMPLANT
TOWEL OR NON WOVEN STRL DISP B (DISPOSABLE) ×2 IMPLANT
TUBE CONNECTING 20X1/4 (TUBING) ×2 IMPLANT
WATER STERILE IRR 1000ML POUR (IV SOLUTION) ×1 IMPLANT
YANKAUER SUCT BULB TIP NO VENT (SUCTIONS) ×2 IMPLANT

## 2011-10-27 NOTE — Anesthesia Preprocedure Evaluation (Signed)
Anesthesia Evaluation  Patient identified by MRN, date of birth, ID band Patient awake    Reviewed: Allergy & Precautions, H&P , NPO status , Patient's Chart, lab work & pertinent test results, reviewed documented beta blocker date and time   Airway Mallampati: II TM Distance: >3 FB Neck ROM: full    Dental   Pulmonary neg pulmonary ROS,          Cardiovascular negative cardio ROS      Neuro/Psych negative neurological ROS  negative psych ROS   GI/Hepatic negative GI ROS, Neg liver ROS,   Endo/Other  negative endocrine ROS  Renal/GU negative Renal ROS  negative genitourinary   Musculoskeletal   Abdominal   Peds  Hematology negative hematology ROS (+)   Anesthesia Other Findings See surgeon's H&P   Reproductive/Obstetrics negative OB ROS                           Anesthesia Physical Anesthesia Plan  ASA: I  Anesthesia Plan: General   Post-op Pain Management:    Induction: Intravenous  Airway Management Planned: LMA  Additional Equipment:   Intra-op Plan:   Post-operative Plan: Extubation in OR  Informed Consent: I have reviewed the patients History and Physical, chart, labs and discussed the procedure including the risks, benefits and alternatives for the proposed anesthesia with the patient or authorized representative who has indicated his/her understanding and acceptance.   Dental Advisory Given  Plan Discussed with: CRNA and Surgeon  Anesthesia Plan Comments:         Anesthesia Quick Evaluation  

## 2011-10-27 NOTE — Discharge Instructions (Signed)
[facility] °[phone] ° ° °BREAST BIOPSY/ PARTIAL MASTECTOMY: POST OP INSTRUCTIONS ° °Always review your discharge instruction sheet given to you by the facility where your surgery was performed. ° °IF YOU HAVE DISABILITY OR FAMILY LEAVE FORMS, YOU MUST BRING THEM TO THE OFFICE FOR PROCESSING.  DO NOT GIVE THEM TO YOUR DOCTOR. ° °1. A prescription for pain medication will be given to you upon discharge.  Take your pain medication as prescribed, as needed.  If narcotic pain medicine is not needed, then you may take ibuprofen (Advil) as needed. °2. Take your usually prescribed medications unless otherwise directed °3. If you need a refill on your pain medication, please contact your pharmacy.  They will contact our office to request authorization.  Prescriptions will not be filled after 5pm or on week-ends. °4. You should eat very light the first 24 hours after surgery, such as soup, crackers, pudding, etc.  Resume your normal diet the day after surgery. °5. Most patients will experience some swelling and bruising in the breast.  Ice packs and a good support bra will help.  Swelling and bruising can take several days to resolve.  °6. It is common to experience some constipation if taking pain medication after surgery.  Increasing fluid intake and taking a stool softener will usually help or prevent this problem from occurring.  A mild laxative (Milk of Magnesia or Miralax) should be taken according to package directions if there are no bowel movements after 48 hours. °7. Unless discharge instructions indicate otherwise, you may remove your bandages 24 hours after surgery, and you may shower at that time.  If your surgeon used skin glue on the incision, you may shower in 24 hours.  The glue will flake off over the next 2-3 weeks. °8. DRAINS:  If you have drain, it is important to keep a list of the amount of drainage produced each day in your drains.  Before leaving the hospital, you should  be instructed on drain care.  Call our office if you have any questions about your drains. BE SURE TO BRING THE RECORD OF THE AMOUNT OF DRAINAGE TO YOUR OFFICE VISITS. We use this to determine when the drains can be removed. °9. ACTIVITIES:  You may resume regular daily activities (gradually increasing) beginning the next day.  Wearing a good support bra or sports bra minimizes pain and swelling.  You may have sexual intercourse when it is comfortable. °a. You may drive when you no longer are taking prescription pain medication, you can comfortably wear a seatbelt, and you can safely maneuver your car and apply brakes. °b. RETURN TO WORK:  ______________________________________________________________________________________ °10. You should see your doctor in the office for a follow-up appointment approximately two weeks after your surgery.  Your doctor’s nurse will typically make your follow-up appointment when she calls you with your pathology report.  Expect your pathology report 2-3 business days after your surgery.  You may call to check if you do not hear from us after three days. °11. OTHER INSTRUCTIONS: _______________________________________________________________________________________________ _____________________________________________________________________________________________________________________________________ °_____________________________________________________________________________________________________________________________________ °_____________________________________________________________________________________________________________________________________ ° °WHEN TO CALL YOUR DOCTOR: °1. Fever over 101.0 °2. Nausea and/or vomiting. °3. Extreme swelling or bruising. °4. Continued bleeding from incision. °5. Increased pain, redness, or drainage from the incision. ° °The clinic staff is available to answer your questions during regular business hours.  Please don’t  hesitate to call and ask to speak to one of the nurses for clinical concerns.  If you have a medical emergency, go   to the nearest emergency room or call 911.  A surgeon from [facility] is always on call at the hospital. ° °[address], [address], [place], [place]  [address] ?  °[address] [address], [place], [place]   [address] °                          [phone] ? [phone] ? FAX [phone] °Web site: [email or web address] ° ° ° °Post Anesthesia Home Care Instructions ° °Activity: °Get plenty of rest for the remainder of the day. A responsible adult should stay with you for 24 hours following the procedure.  °For the next 24 hours, DO NOT: °-Drive a car °-Operate machinery °-Drink alcoholic beverages °-Take any medication unless instructed by your physician °-Make any legal decisions or sign important papers. ° °Meals: °Start with liquid foods such as gelatin or soup. Progress to regular foods as tolerated. Avoid greasy, spicy, heavy foods. If nausea and/or vomiting occur, drink only clear liquids until the nausea and/or vomiting subsides. Call your physician if vomiting continues. ° °Special Instructions/Symptoms: °Your throat may feel dry or sore from the anesthesia or the breathing tube placed in your throat during surgery. If this causes discomfort, gargle with warm salt water. The discomfort should disappear within 24 hours. ° ° °

## 2011-10-27 NOTE — Op Note (Signed)
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 I took a biopsy but we did not get enough tissue for a clear diagnosis. She presents today for excision.    Description of Procedure: I saw the patient in the preoperative area and we confirmed the plans. She had no further questions. I marked the left breast the operative side.  Patient taken to the operating room after satisfactory general anesthesia was obtained the left breast was prepped and draped in a time out was done. It is magnification for visualization. There was a papillomatous type lesion protruding through the 11:00 position duct. I was able to cannulate the duct with a tear duct probe and it went towards the edge of the areola at about the 10:30 position. I then made a curvilinear incision at the real margin. Elevated a very thin skin flap. There are some dilated ducts with whitish material in them. However as I got towards the duct the papillomatous lesion was able to be removed and pulled back out of the duct and excised. I thought it came out intact.  I made sure it was dry. I put 20 cc of 0.25% plain Marcaine in. I irrigated. I cauterized the tip of the nipple where the duct had protruded incision was closed in layers with 3-0 Vicryl, 4:00 subcuticular and Dermabond. The patient tolerated the procedure well. There were no complications. Counts were correct.Currie Paris, MD, FACS 10/27/2011 1:54 PM

## 2011-10-27 NOTE — H&P (Signed)
  Chief complaint: Nipple discharge  History of present illness: This patient was seen by me several months ago with a small bit of tissue protruding from the left nipple and some drainage. I was able to biopsy this tissue and it appeared to be inflammatory. However she continued to have spontaneous clear nipple discharge from a single duct in the 11:00 position on the left. She comes today for surgery No issues since last visit  Past history, family history, and review of systems are basically unchanged from her prior visit.   Meds:  Current Facility-Administered Medications  Medication Dose Route Frequency Provider Last Rate Last Dose  . acetaminophen (OFIRMEV) IV 1,000 mg  1,000 mg Intravenous Once Hart Robinsons, MD   1,000 mg at 10/27/11 1239  . lactated ringers infusion   Intravenous Continuous Bedelia Person, MD 10 mL/hr at 10/27/11 1236     Allegy:  Allergies  Allergen Reactions  . Codeine     REACTION: nausea  . Sulfonamide Derivatives     REACTION: rash    Exam:  Vs: BP 136/76  Pulse 70  Temp(Src) 96.9 F (36.1 C) (Temporal)  Resp 18  Ht 5\' 7"  (1.702 m)  Wt 187 lb 6 oz (84.993 kg)  BMI 29.35 kg/m2  GENERAL:  The patient is alert, oriented, and generally healthy-appearing, NAD. Mood and affect are normal.  HEENT:  The head is normocephalic, the eyes nonicteric, the pupils were round regular and equal. EOMs are normal. Pharynx normal. Dentition good.  NECK:  The neck is supple and there are no masses or thyromegaly.  LUNGS:  Normal respirations and clear to auscultation.  HEART:  Regular rhythm, with no murmurs rubs or gallops. Pulses are intact carotid dorsalis pedis and posterior tibial. No significant varicosities are noted.  BREASTS:  The right breast is completely normal. The left breast is normal to both inspection and palpation. However with gentle manipulation there is a clear yellow discharge from a duct at the 11:00 position. I did not appreciate any mass  underneath this. There is no tenderness.  ABDOMEN:  Soft, flat, and nontender. No masses or organomegaly is noted. No hernias are noted. Bowel sounds are normal.  EXTREMITIES:  Good range of motion, no edema.  IMPRESSION: Nipple discharge left  PLAN: discussed alternatives with patient and she would like to proceed to ductal excision.I have marked the left breast as the operative side

## 2011-10-27 NOTE — Anesthesia Postprocedure Evaluation (Signed)
Anesthesia Post Note  Patient: Samantha Mayer  Procedure(s) Performed: Procedure(s) (LRB): EXCISION DUCTAL SYSTEM BREAST (Left)  Anesthesia type: General  Patient location: PACU  Post pain: Pain level controlled  Post assessment: Patient's Cardiovascular Status Stable  Last Vitals:  Filed Vitals:   10/27/11 1500  BP: 108/68  Pulse: 73  Temp:   Resp: 15    Post vital signs: Reviewed and stable  Level of consciousness: alert  Complications: No apparent anesthesia complications

## 2011-10-27 NOTE — Transfer of Care (Signed)
Immediate Anesthesia Transfer of Care Note  Patient: Samantha Mayer  Procedure(s) Performed: Procedure(s) (LRB): EXCISION DUCTAL SYSTEM BREAST (Left)  Patient Location: PACU  Anesthesia Type: General  Level of Consciousness: awake, alert , oriented and patient cooperative  Airway & Oxygen Therapy: Patient Spontanous Breathing and Patient connected to face mask oxygen  Post-op Assessment: Report given to PACU RN and Post -op Vital signs reviewed and stable  Post vital signs: Reviewed and stable  Complications: No apparent anesthesia complications

## 2011-10-27 NOTE — Anesthesia Procedure Notes (Signed)
Procedure Name: LMA Insertion Date/Time: 10/27/2011 1:14 PM Performed by: Verlan Friends Pre-anesthesia Checklist: Patient identified, Emergency Drugs available, Suction available, Patient being monitored and Timeout performed Patient Re-evaluated:Patient Re-evaluated prior to inductionOxygen Delivery Method: Circle System Utilized Preoxygenation: Pre-oxygenation with 100% oxygen Intubation Type: IV induction Ventilation: Mask ventilation without difficulty LMA: LMA inserted LMA Size: 4.0 Number of attempts: 1 (atraumatic) Airway Equipment and Method: bite block Placement Confirmation: positive ETCO2 Tube secured with: Tape Dental Injury: Teeth and Oropharynx as per pre-operative assessment

## 2011-10-30 ENCOUNTER — Encounter (HOSPITAL_BASED_OUTPATIENT_CLINIC_OR_DEPARTMENT_OTHER): Payer: Self-pay | Admitting: Surgery

## 2011-10-30 ENCOUNTER — Telehealth (INDEPENDENT_AMBULATORY_CARE_PROVIDER_SITE_OTHER): Payer: Self-pay | Admitting: General Surgery

## 2011-10-30 NOTE — Telephone Encounter (Signed)
Message copied by Liliana Cline on Mon Oct 30, 2011 11:58 AM ------      Message from: Currie Paris      Created: Mon Oct 30, 2011 11:16 AM       Tell her path is benign and as expected

## 2011-10-30 NOTE — Telephone Encounter (Signed)
Called patient to give path results. No answer. No way to leave message.  

## 2011-10-30 NOTE — Telephone Encounter (Signed)
Patient made aware of path results. Will follow up at appt and call with any questions prior.  

## 2011-12-11 ENCOUNTER — Other Ambulatory Visit (INDEPENDENT_AMBULATORY_CARE_PROVIDER_SITE_OTHER): Payer: Self-pay | Admitting: Surgery

## 2011-12-11 DIAGNOSIS — Z1231 Encounter for screening mammogram for malignant neoplasm of breast: Secondary | ICD-10-CM

## 2011-12-19 ENCOUNTER — Encounter (INDEPENDENT_AMBULATORY_CARE_PROVIDER_SITE_OTHER): Payer: Self-pay | Admitting: Surgery

## 2011-12-19 ENCOUNTER — Ambulatory Visit (INDEPENDENT_AMBULATORY_CARE_PROVIDER_SITE_OTHER): Payer: BC Managed Care – PPO | Admitting: Surgery

## 2011-12-19 VITALS — BP 124/82 | HR 64 | Temp 98.8°F | Resp 14 | Ht 67.0 in | Wt 187.2 lb

## 2011-12-19 DIAGNOSIS — D249 Benign neoplasm of unspecified breast: Secondary | ICD-10-CM

## 2011-12-19 DIAGNOSIS — Z9889 Other specified postprocedural states: Secondary | ICD-10-CM

## 2011-12-19 NOTE — Patient Instructions (Signed)
We will see you again on an as needed basis. Please call the office at [phone] if you have any questions or concerns. Thank you for allowing us to take care of you.  

## 2011-12-19 NOTE — Progress Notes (Signed)
Samantha Mayer                                            DOB: 1956-04-29 DATE: 12/19/2011                                                  MRN: [id number]  CC: Post op   HPI: This patient comes in for post op follow-up.Sheunderwent left breast ductal excison on 10/27/11. She feels that she is doing well.  PE:  VITAL SIGNS: BP 124/82  Pulse 64  Temp 98.8 F (37.1 C) (Temporal)  Resp 14  Ht 5\' 7"  (1.702 m)  Wt 187 lb 3.2 oz (84.913 kg)  BMI 29.32 kg/m2  General: The patient appears to be healthy, NAD Incision healing nicely  DATA REVIEWED: Path showed intraductal papilloma  IMPRESSION: The patient is doing well S/P left ductal excision.    PLAN: RTC PRN Gave her a copy of path and reviewed it with her

## 2011-12-28 ENCOUNTER — Telehealth: Payer: Self-pay | Admitting: Family Medicine

## 2011-12-28 ENCOUNTER — Ambulatory Visit
Admission: RE | Admit: 2011-12-28 | Discharge: 2011-12-28 | Disposition: A | Discharge status: Home / Self Care | Payer: BC Managed Care – PPO | Source: Ambulatory Visit | Attending: Surgery | Admitting: Surgery

## 2011-12-28 DIAGNOSIS — Z1231 Encounter for screening mammogram for malignant neoplasm of breast: Secondary | ICD-10-CM

## 2011-12-28 NOTE — Telephone Encounter (Signed)
Patient called stating that she would like to be referred to an ortho MD for her knee. Please advise.

## 2011-12-28 NOTE — Telephone Encounter (Signed)
Recommend evaluation here first then we can better advise

## 2011-12-29 ENCOUNTER — Other Ambulatory Visit (INDEPENDENT_AMBULATORY_CARE_PROVIDER_SITE_OTHER): Payer: Self-pay | Admitting: Surgery

## 2011-12-29 DIAGNOSIS — R928 Other abnormal and inconclusive findings on diagnostic imaging of breast: Secondary | ICD-10-CM

## 2011-12-29 NOTE — Telephone Encounter (Signed)
Appointment made

## 2012-01-02 ENCOUNTER — Encounter: Payer: Self-pay | Admitting: Family Medicine

## 2012-01-02 ENCOUNTER — Ambulatory Visit (INDEPENDENT_AMBULATORY_CARE_PROVIDER_SITE_OTHER): Payer: BC Managed Care – PPO | Admitting: Family Medicine

## 2012-01-02 ENCOUNTER — Ambulatory Visit (INDEPENDENT_AMBULATORY_CARE_PROVIDER_SITE_OTHER)
Admission: RE | Admit: 2012-01-02 | Discharge: 2012-01-02 | Disposition: A | Discharge status: Home / Self Care | Payer: BC Managed Care – PPO | Source: Ambulatory Visit | Attending: Family Medicine | Admitting: Family Medicine

## 2012-01-02 VITALS — BP 120/70 | Temp 99.0°F | Wt 188.0 lb

## 2012-01-02 DIAGNOSIS — M25562 Pain in left knee: Secondary | ICD-10-CM

## 2012-01-02 DIAGNOSIS — M25569 Pain in unspecified knee: Secondary | ICD-10-CM

## 2012-01-02 NOTE — Patient Instructions (Addendum)
If x-rays normal, try over the counter anti inflammatory such as Aleve

## 2012-01-02 NOTE — Progress Notes (Signed)
  Subjective:    Patient ID: EDIT RICCIARDELLI, female    DOB: 09/24/1955, 56 y.o.   MRN: [id number]  HPI  Bilateral knee pain. Patient [place] while out of country. She was in [place] and fell on some steps and apparently had abrasion left anterior knee and bruising right knee. No x-rays were done. Abrasion has healed fully. She has bilateral knee pain. Right knee hurts deep posterior to patella. No persistent bruising. No locking or giving way. Left knee pain is more medial. No effusions. Worse with ambulation. Has taken no medications. No alleviating factors. No prior history of knee difficulties.   Review of Systems  Musculoskeletal: Negative for myalgias and gait problem.  Neurological: Negative for dizziness.       Objective:   Physical Exam  Constitutional: She appears well-developed and well-nourished.  Cardiovascular: Normal rate and regular rhythm.   Pulmonary/Chest: Effort normal and breath sounds normal. No respiratory distress. She has no wheezes. She has no rales.  Musculoskeletal:       Right knee reveals no effusion. No ecchymosis. No warmth. Full range of motion. No localized tenderness. Ligament testing is normal.  Left knee reveals no ecchymosis. No effusion. No warmth or erythema. Full range of motion. Mild medial joint line tenderness. No lateral joint line tenderness. Ligament testing is normal.          Assessment & Plan:  Persistent bilateral knee pain following fall 1 month ago. Start with plain x-rays given persistence of pain with walking. She has no evidence for effusion. If x-rays negative, try over-the-counter anti inflammatory such as Aleve.

## 2012-01-03 ENCOUNTER — Telehealth: Payer: Self-pay | Admitting: Family Medicine

## 2012-01-03 NOTE — Telephone Encounter (Signed)
Pt requesting results of X-rays

## 2012-01-03 NOTE — Progress Notes (Signed)
Quick Note:  Pt informed ______ 

## 2012-01-03 NOTE — Telephone Encounter (Signed)
Pt given report

## 2012-01-04 ENCOUNTER — Ambulatory Visit
Admission: RE | Admit: 2012-01-04 | Discharge: 2012-01-04 | Disposition: A | Discharge status: Home / Self Care | Payer: BC Managed Care – PPO | Source: Ambulatory Visit | Attending: Surgery | Admitting: Surgery

## 2012-01-04 DIAGNOSIS — R928 Other abnormal and inconclusive findings on diagnostic imaging of breast: Secondary | ICD-10-CM

## 2012-06-18 ENCOUNTER — Other Ambulatory Visit: Payer: Self-pay | Admitting: Obstetrics and Gynecology

## 2012-07-15 ENCOUNTER — Telehealth: Payer: Self-pay | Admitting: Family Medicine

## 2012-07-15 NOTE — Telephone Encounter (Signed)
Ok to create a slot.

## 2012-07-15 NOTE — Telephone Encounter (Signed)
Pt needs cpx after 330pm. Can I create 30 min slot?

## 2012-07-16 NOTE — Telephone Encounter (Signed)
Pt is sch for 3-7

## 2012-07-16 NOTE — Telephone Encounter (Signed)
lmom for pt to call back

## 2012-07-22 ENCOUNTER — Encounter: Payer: Self-pay | Admitting: Family Medicine

## 2012-07-22 ENCOUNTER — Other Ambulatory Visit (INDEPENDENT_AMBULATORY_CARE_PROVIDER_SITE_OTHER): Payer: BC Managed Care – PPO

## 2012-07-22 DIAGNOSIS — Z0000 Encounter for general adult medical examination without abnormal findings: Secondary | ICD-10-CM

## 2012-07-22 LAB — POCT URINALYSIS DIPSTICK
Glucose, UA: NEGATIVE
Ketones, UA: NEGATIVE
Spec Grav, UA: 1.01
Urobilinogen, UA: 0.2

## 2012-07-22 LAB — LIPID PANEL
HDL: 40.4 mg/dL (ref >39.00)
Total CHOL/HDL Ratio: 5
Triglycerides: 142 mg/dL (ref 0.0–149.0)
VLDL: 28.4 mg/dL (ref 0.0–40.0)

## 2012-07-22 LAB — CBC WITH DIFFERENTIAL/PLATELET
Basophils Absolute: 0 10*3/uL (ref 0.0–0.1)
Basophils Relative: 0.3 % (ref 0.0–3.0)
Hemoglobin: 13.2 g/dL (ref 12.0–15.0)
Lymphocytes Relative: 24.9 % (ref 12.0–46.0)
Monocytes Relative: 5.2 % (ref 3.0–12.0)
Neutro Abs: 5.7 10*3/uL (ref 1.4–7.7)
RBC: 4.43 Mil/uL (ref 3.87–5.11)
RDW: 12 % (ref 11.5–14.6)

## 2012-07-22 LAB — BASIC METABOLIC PANEL
Calcium: 9.3 mg/dL (ref 8.4–10.5)
GFR: 79.74 mL/min (ref >60.00)
Sodium: 139 mEq/L (ref 135–145)

## 2012-07-22 LAB — HEPATIC FUNCTION PANEL
AST: 20 U/L (ref 0–37)
Albumin: 3.7 g/dL (ref 3.5–5.2)
Alkaline Phosphatase: 90 U/L (ref 39–117)

## 2012-07-22 LAB — LDL CHOLESTEROL, DIRECT: Direct LDL: 161.6 mg/dL

## 2012-07-26 ENCOUNTER — Encounter: Payer: BC Managed Care – PPO | Admitting: Family Medicine

## 2012-07-29 ENCOUNTER — Encounter: Payer: Self-pay | Admitting: Family Medicine

## 2012-07-29 ENCOUNTER — Ambulatory Visit (INDEPENDENT_AMBULATORY_CARE_PROVIDER_SITE_OTHER): Payer: BC Managed Care – PPO | Admitting: Family Medicine

## 2012-07-29 VITALS — BP 110/80 | HR 72 | Temp 98.4°F | Resp 12 | Ht 67.0 in | Wt 184.0 lb

## 2012-07-29 DIAGNOSIS — Z0000 Encounter for general adult medical examination without abnormal findings: Secondary | ICD-10-CM

## 2012-07-29 DIAGNOSIS — E785 Hyperlipidemia, unspecified: Secondary | ICD-10-CM

## 2012-07-29 MED ORDER — ATORVASTATIN CALCIUM 10 MG PO TABS: 10.0000 mg | ORAL_TABLET | Freq: Every day | ORAL | Status: DC

## 2012-07-29 NOTE — Progress Notes (Signed)
Subjective:    Patient ID: Samantha Mayer, female    DOB: [birth year], 57 y.o.   MRN: [id number]  HPI Complete physical.  Patient saw a gynecologist recently and recent normal Pap smear. Mammogram within the past year normal. Still has not had screening colonoscopy but has been referred by gynecologist and she plans to set this up later. Does not exercise consistently. Takes regular calcium and vitamin D.  Strong family history of premature CAD. Brother in the past year at age 3 had MI. Father had coronary disease at early age. Patient denies any chest pains. No history of diabetes. No history of hypertension. Dyslipidemia but never treated with statin medication  Past Medical History  Diagnosis Date  . Nipple discharge     bloody in lft  . Papilloma of breast     lft  . Hearing loss   . Urgency incontinence   . Hyperlipidemia     borderline  . Seasonal allergies   . No pertinent past medical history    Past Surgical History  Procedure Laterality Date  . Tonsillectomy    . Arcuate keratectomy  1987  . Breast papilloma excision    . Abdominal hysterectomy  1993    Leiomyomas  . Eye surgery      karatotomy  . Breast ductal system excision  10/27/2011    Procedure: EXCISION DUCTAL SYSTEM BREAST;  Surgeon: Currie Paris, MD;  Location: [facility];  Service: General;  Laterality: Left;    reports that she quit smoking about 20 years ago. She does not have any smokeless tobacco history on file. She reports that  drinks alcohol. She reports that she does not use illicit drugs. family history includes COPD in her father; Heart disease in her father; Heart disease (age of onset: 78) in her brother; and Hypertension in her mother. Allergies  Allergen Reactions  . Codeine     REACTION: nausea  . Sulfonamide Derivatives     REACTION: rash      Review of Systems  Constitutional: Negative for fever, activity change, appetite change, fatigue and unexpected  weight change.  HENT: Negative for hearing loss, ear pain, sore throat and trouble swallowing.   Eyes: Negative for visual disturbance.  Respiratory: Negative for cough and shortness of breath.   Cardiovascular: Negative for chest pain and palpitations.  Gastrointestinal: Negative for abdominal pain, diarrhea, constipation and blood in stool.  Genitourinary: Negative for dysuria and hematuria.  Musculoskeletal: Negative for myalgias, back pain and arthralgias.  Skin: Negative for rash.  Neurological: Negative for dizziness, syncope and headaches.  Hematological: Negative for adenopathy.  Psychiatric/Behavioral: Negative for confusion and dysphoric mood.       Objective:   Physical Exam  Constitutional: She is oriented to person, place, and time. She appears well-developed and well-nourished.  HENT:  Head: Normocephalic and atraumatic.  Eyes: EOM are normal. Pupils are equal, round, and reactive to light.  Neck: Normal range of motion. Neck supple. No thyromegaly present.  Cardiovascular: Normal rate, regular rhythm and normal heart sounds.   No murmur heard. Pulmonary/Chest: Breath sounds normal. No respiratory distress. She has no wheezes. She has no rales.  Abdominal: Soft. Bowel sounds are normal. She exhibits no distension and no mass. There is no tenderness. There is no rebound and no guarding.  Genitourinary:  Per GYN  Musculoskeletal: Normal range of motion. She exhibits no edema.  Lymphadenopathy:    She has no cervical adenopathy.  Neurological: She is alert and  oriented to person, place, and time. She displays normal reflexes. No cranial nerve deficit.  Skin: No rash noted.  Psychiatric: She has a normal mood and affect. Her behavior is normal. Judgment and thought content normal.          Assessment & Plan:  Complete physical. Patient has HDL 40 and LDL 161. Very strong family history of premature CAD. Start Lipitor 10 mg daily and recheck lipids and hepatic in 6-8  weeks. Establish more consistent exercise. Strongly advocate screening colonoscopy this year.  She will continue GYN followup

## 2012-07-29 NOTE — Patient Instructions (Addendum)

## 2012-09-11 ENCOUNTER — Telehealth: Payer: Self-pay | Admitting: Family Medicine

## 2012-09-11 NOTE — Telephone Encounter (Signed)
Patient Information:  Caller Name: Neri  Phone: [phone]  Patient: Samantha Mayer  Gender: Female  DOB: 1956/04/27  Age: 57 Years  PCP: Evelena Peat (Family Practice)  Office Follow Up:  Does the office need to follow up with this patient?: No  Instructions For The Office: N/A  RN Note:  She said since starting her Lipitor she has bilateral feet swelling goes up about 4 inches into lower leg.  Left leg slightly more swollen than left.  She has no chest pain or shortness of breath   No coolness or blueness.   Only slight relief with elevation  Symptoms  Reason For Call & Symptoms: feet swelling  Reviewed Health History In EMR: Yes  Reviewed Medications In EMR: Yes  Reviewed Allergies In EMR: Yes  Reviewed Surgeries / Procedures: Yes  Date of Onset of Symptoms: 09/08/2012  Guideline(s) Used:  Leg Swelling and Edema  Disposition Per Guideline:   See Within 3 Days in Office  Reason For Disposition Reached:   Mild swelling of both ankles (i.e., pedal edema) AND new onset or worsening  Advice Given:  Call Back If:  Swelling becomes worse  You become worse.  Patient Will Follow Care Advice:  YES  Appointment Scheduled:  09/12/2012 16:00:00 Appointment Scheduled Provider:  Evelena Peat [facility])

## 2012-09-12 ENCOUNTER — Encounter: Payer: Self-pay | Admitting: Family Medicine

## 2012-09-12 ENCOUNTER — Ambulatory Visit (INDEPENDENT_AMBULATORY_CARE_PROVIDER_SITE_OTHER): Payer: BC Managed Care – PPO | Admitting: Family Medicine

## 2012-09-12 VITALS — BP 120/78 | Temp 98.6°F | Wt 190.0 lb

## 2012-09-12 DIAGNOSIS — R609 Edema, unspecified: Secondary | ICD-10-CM

## 2012-09-12 DIAGNOSIS — R600 Localized edema: Secondary | ICD-10-CM

## 2012-09-12 MED ORDER — FUROSEMIDE 20 MG PO TABS: ORAL_TABLET | ORAL | Status: DC

## 2012-09-12 NOTE — Progress Notes (Signed)
  Subjective:    Patient ID: Samantha Mayer, female    DOB: 11-17-1955, 57 y.o.   MRN: [id number]  HPI Patient seen with bilateral leg edema. Onset about 4 days ago. She was traveling to [facility]. Ate out frequently and probably more sodium intake than usual No recent nonsteroidal use. No new medications. Denies any recent dizziness. No dyspnea. Elevation with minimal improvement. Recent lab work including creatinine, albumin, TSH all normal  Past Medical History  Diagnosis Date  . Nipple discharge     bloody in lft  . Papilloma of breast     lft  . Hearing loss   . Urgency incontinence   . Hyperlipidemia     borderline  . Seasonal allergies   . No pertinent past medical history    Past Surgical History  Procedure Laterality Date  . Tonsillectomy    . Arcuate keratectomy  1987  . Breast papilloma excision    . Abdominal hysterectomy  1993    Leiomyomas  . Eye surgery      karatotomy  . Breast ductal system excision  10/27/2011    Procedure: EXCISION DUCTAL SYSTEM BREAST;  Surgeon: Currie Paris, MD;  Location: [facility];  Service: General;  Laterality: Left;    reports that she quit smoking about 21 years ago. She does not have any smokeless tobacco history on file. She reports that  drinks alcohol. She reports that she does not use illicit drugs. family history includes COPD in her father; Heart disease in her father; Heart disease (age of onset: 40) in her brother; and Hypertension in her mother. Allergies  Allergen Reactions  . Codeine     REACTION: nausea  . Sulfonamide Derivatives     REACTION: rash      Review of Systems  Constitutional: Negative for appetite change and unexpected weight change.  Respiratory: Negative for cough and shortness of breath.   Cardiovascular: Positive for leg swelling. Negative for chest pain and palpitations.  Neurological: Negative for dizziness and syncope.       Objective:   Physical Exam   Constitutional: She appears well-developed and well-nourished. No distress.  Neck: Neck supple. No thyromegaly present.  Cardiovascular: Normal rate and regular rhythm.   Pulmonary/Chest: Effort normal and breath sounds normal. No respiratory distress. She has no wheezes. She has no rales.  Musculoskeletal: She exhibits edema.  Patient has trace pitting edema feet legs and ankles bilaterally. Her edema is very symmetric          Assessment & Plan:  Bilateral leg edema. No evidence for CHF or other worrisome etiology. Probably related to recent travels and increased dietary sodium. Short-term use only of furosemide 20 mg once daily. High potassium diet. Touch base 4-5 days if edema not improving

## 2012-09-12 NOTE — Patient Instructions (Addendum)
Foods Rich in Potassium Food / Potassium (mg)  Apricots, dried,  cup / 378 mg   Apricots, raw, 1 cup halves / 401 mg   Avocado,  / 487 mg   Banana, 1 large / 487 mg   Beef, lean, round, 3 oz / 202 mg   Cantaloupe, 1 cup cubes / 427 mg   Dates, medjool, 5 whole / 835 mg   Ham, cured, 3 oz / 212 mg   Lentils, dried,  cup / 458 mg   Lima beans, frozen,  cup / 258 mg   Orange, 1 large / 333 mg   Orange juice, 1 cup / 443 mg   Peaches, dried,  cup / 398 mg   Peas, split, cooked,  cup / 355 mg   Potato, boiled, 1 medium / 515 mg   Prunes, dried, uncooked,  cup / 318 mg   Raisins,  cup / 309 mg   Salmon, pink, raw, 3 oz / 275 mg   Sardines, canned , 3 oz / 338 mg   Tomato, raw, 1 medium / 292 mg   Tomato juice, 6 oz / 417 mg   Turkey, 3 oz / 349 mg  Document Released: 05/08/2005 Document Revised: 01/18/2011 Document Reviewed: 09/21/2008 ExitCare Patient Information 2012 [employer]. 

## 2012-10-24 ENCOUNTER — Encounter: Payer: Self-pay | Admitting: Gastroenterology

## 2012-10-30 ENCOUNTER — Other Ambulatory Visit (INDEPENDENT_AMBULATORY_CARE_PROVIDER_SITE_OTHER): Payer: BC Managed Care – PPO

## 2012-10-30 ENCOUNTER — Ambulatory Visit (AMBULATORY_SURGERY_CENTER): Payer: BC Managed Care – PPO | Admitting: *Deleted

## 2012-10-30 VITALS — Ht 67.0 in | Wt 184.2 lb

## 2012-10-30 DIAGNOSIS — Z1211 Encounter for screening for malignant neoplasm of colon: Secondary | ICD-10-CM

## 2012-10-30 DIAGNOSIS — E785 Hyperlipidemia, unspecified: Secondary | ICD-10-CM

## 2012-10-30 LAB — HEPATIC FUNCTION PANEL
ALT: 28 U/L (ref 0–35)
AST: 27 U/L (ref 0–37)
Alkaline Phosphatase: 91 U/L (ref 39–117)
Bilirubin, Direct: 0.1 mg/dL (ref 0.0–0.3)
Total Bilirubin: 0.9 mg/dL (ref 0.3–1.2)

## 2012-10-30 LAB — LIPID PANEL: HDL: 47.7 mg/dL (ref >39.00)

## 2012-10-30 MED ORDER — NA SULFATE-K SULFATE-MG SULF 17.5-3.13-1.6 GM/177ML PO SOLN: ORAL | Status: DC

## 2012-10-30 NOTE — Progress Notes (Signed)
Quick Note:  Pt informed on home VM ______ 

## 2012-10-31 ENCOUNTER — Encounter: Payer: Self-pay | Admitting: Gastroenterology

## 2012-11-08 ENCOUNTER — Encounter: Payer: Self-pay | Admitting: Gastroenterology

## 2012-11-08 ENCOUNTER — Ambulatory Visit (AMBULATORY_SURGERY_CENTER): Payer: BC Managed Care – PPO | Admitting: Gastroenterology

## 2012-11-08 VITALS — BP 119/62 | HR 58 | Temp 97.7°F | Resp 14 | Ht 67.0 in | Wt 184.0 lb

## 2012-11-08 DIAGNOSIS — Z1211 Encounter for screening for malignant neoplasm of colon: Secondary | ICD-10-CM

## 2012-11-08 DIAGNOSIS — K648 Other hemorrhoids: Secondary | ICD-10-CM

## 2012-11-08 MED ORDER — SODIUM CHLORIDE 0.9 % IV SOLN: 500.0000 mL | INTRAVENOUS | Status: DC

## 2012-11-08 NOTE — Progress Notes (Signed)
Patient did not experience any of the following events: a burn prior to discharge; a fall within the facility; wrong site/side/patient/procedure/implant event; or a hospital transfer or hospital admission upon discharge from the facility. (G8907) Patient did not have preoperative order for IV antibiotic SSI prophylaxis. (G8918)  

## 2012-11-08 NOTE — Patient Instructions (Addendum)

## 2012-11-08 NOTE — Progress Notes (Signed)
Report to pacu rn, vss, bbs=clear 

## 2012-11-08 NOTE — Op Note (Signed)
[facility] [address] [place], [address]   COLONOSCOPY PROCEDURE REPORT  PATIENT: Samantha Mayer, Samantha Mayer  MR#: [id number] BIRTHDATE: [birth year] , 57  yrs. old GENDER: Female ENDOSCOPIST: Louis Meckel, MD REFERRED ON:GEXB Dareen Piano, M.D. PROCEDURE DATE:  11/08/2012 PROCEDURE:   Colonoscopy, diagnostic ASA CLASS:   Class II INDICATIONS:average risk screening. MEDICATIONS: MAC sedation, administered by CRNA and Propofol (Diprivan) 280 mg IV  DESCRIPTION OF PROCEDURE:   After the risks benefits and alternatives of the procedure were thoroughly explained, informed consent was obtained.  A digital rectal exam revealed no abnormalities of the rectum.   The LB [id number]  endoscope was introduced through the anus and advanced to the cecum, which was identified by both the appendix and ileocecal valve. No adverse events experienced.   The quality of the prep was excellent using Suprep  The instrument was then slowly withdrawn as the colon was fully examined.      COLON FINDINGS: Internal hemorrhoids were found.   The colon mucosa was otherwise normal.  Retroflexed views revealed no abnormalities. The time to cecum=2 minutes 01 seconds.  Withdrawal time=8 minutes 02 seconds.  The scope was withdrawn and the procedure completed. COMPLICATIONS: There were no complications.  ENDOSCOPIC IMPRESSION: 1.   Internal hemorrhoids 2.   The colon mucosa was otherwise normal  RECOMMENDATIONS: Continue current colorectal screening recommendations for "routine risk" patients with a repeat colonoscopy in 10 years.   eSigned:  Louis Meckel, MD 11/08/2012 11:23 AM   cc:

## 2012-11-10 ENCOUNTER — Other Ambulatory Visit: Payer: Self-pay | Admitting: Family Medicine

## 2012-11-11 ENCOUNTER — Telehealth: Payer: Self-pay | Admitting: *Deleted

## 2012-11-11 NOTE — Telephone Encounter (Signed)
Rx request to pharmacy/SLS  

## 2012-11-11 NOTE — Telephone Encounter (Signed)
  Follow up Call-  Call back number 11/08/2012  Post procedure Call Back phone  # [phone]  Permission to leave phone message Yes     Patient questions:  Do you have a fever, pain , or abdominal swelling? no Pain Score  0 *  Have you tolerated food without any problems? yes  Have you been able to return to your normal activities? yes  Do you have any questions about your discharge instructions: Diet   no Medications  no Follow up visit  no  Do you have questions or concerns about your Care? no  Actions: * If pain score is 4 or above: No action needed, pain <4.

## 2012-11-12 ENCOUNTER — Telehealth: Payer: Self-pay | Admitting: Gastroenterology

## 2012-11-12 NOTE — Telephone Encounter (Signed)
Per pt report for colon states diagnostic colon. Pt states this was a screening colon and she is concerned due to the billing. Per Janelle in precert the procedure has been billed as a screening colon. Spoke with pt and she is aware.

## 2012-11-25 ENCOUNTER — Ambulatory Visit (INDEPENDENT_AMBULATORY_CARE_PROVIDER_SITE_OTHER): Payer: BC Managed Care – PPO | Admitting: Family Medicine

## 2012-11-25 ENCOUNTER — Encounter: Payer: Self-pay | Admitting: Family Medicine

## 2012-11-25 VITALS — BP 120/80 | Temp 98.4°F | Wt 179.0 lb

## 2012-11-25 DIAGNOSIS — M25519 Pain in unspecified shoulder: Secondary | ICD-10-CM

## 2012-11-25 DIAGNOSIS — M25511 Pain in right shoulder: Secondary | ICD-10-CM

## 2012-11-25 NOTE — Progress Notes (Signed)
Chief Complaint  Patient presents with  . right shoulder pain    HPI:  Acute visit for shoulder pain: -started about 1 month ago -thought cause by excessive computer work -pain is located on top of R shoulder, occurs only with certain movements, pain is achy, mild-mod pain -worse with lifting this arm and certain movements of this shoulder, better with rest -denies: fevers, chills, radiation, weakness, numbness, radiation of pain  ROS: See pertinent positives and negatives per HPI.  Past Medical History  Diagnosis Date  . Nipple discharge     bloody in lft  . Papilloma of breast     lft  . Hearing loss   . Urgency incontinence   . Hyperlipidemia     borderline  . Seasonal allergies   . No pertinent past medical history     Family History  Problem Relation Age of Onset  . Hypertension Mother   . COPD Father   . Heart disease Father   . Heart disease Brother 54    CAD  . Colon cancer Neg Hx     History   Social History  . Marital Status: Married    Spouse Name: N/A    Number of Children: N/A  . Years of Education: N/A   Social History Main Topics  . Smoking status: Former Smoker -- 1.00 packs/day for 12 years    Quit date: 09/12/1991  . Smokeless tobacco: Never Used  . Alcohol Use: 0.0 oz/week    1-2 Glasses of wine per week  . Drug Use: No  . Sexually Active: None   Other Topics Concern  . None   Social History Narrative  . None    Current outpatient prescriptions:aspirin 81 MG tablet, Take 81 mg by mouth daily.  , Disp: , Rfl: ;  atorvastatin (LIPITOR) 10 MG tablet, Take 1 tablet (10 mg total) by mouth daily., Disp: 30 tablet, Rfl: 11;  calcium carbonate (OS-CAL) 600 MG TABS, Take 600 mg by mouth 2 (two) times daily with a meal.  , Disp: , Rfl: ;  diphenhydrAMINE (BENADRYL) 25 MG tablet, Take 25 mg by mouth at bedtime., Disp: , Rfl:  furosemide (LASIX) 20 MG tablet, TAKE 1 TABLET BY MOUTH EVERY DAY AS NEEDED FOR LEG EDEMA, Disp: 30 tablet, Rfl: 1;   tolterodine (DETROL LA) 2 MG 24 hr capsule, Take 2 mg by mouth daily.  , Disp: , Rfl:   EXAM:  Filed Vitals:   11/25/12 1418  BP: 120/80  Temp: 98.4 F (36.9 C)    Body mass index is 28.03 kg/(m^2).  GENERAL: vitals reviewed and listed above, alert, oriented, appears well hydrated and in no acute distress  HEENT: atraumatic, conjunttiva clear, no obvious abnormalities on inspection of external nose and ears  NECK: no obvious masses on inspection  MS: moves all extremities without noticeable abnormality -normal ROM bilat shoulders, normal inspection of shoulder, arms and neck, TTP attachment supraspinatus to humerus on R, pain with abd against resistance and empty can, + imping test R, neg neers, neg shawl test, normal muscle strength in upper ext bilat, NV intact PSYCH: pleasant and cooperative, no obvious depression or anxiety  ASSESSMENT AND PLAN:  Discussed the following assessment and plan:  Pain in joint, shoulder region, right  -hx and exam c/w RTC tendonopathy, disucssed implications and other possible etiologies. Will start with conservative tx, instructions and HEP provided. Follow up in one month. -Patient advised to return or notify a doctor immediately if symptoms worsen or  persist or new concerns arise.  Patient Instructions  -heat for 15 minutes twice daily  -ibuprofen ot tylenol per instructions if needed for pain  -home exercises 5-7 times per week: 1) start with circled exercises for 1 week -2) then add other exercises  -schedule follow up with Dr. Caryl Never  In 1 month     KIM, Damita Lack.

## 2012-11-25 NOTE — Patient Instructions (Signed)
-  heat for 15 minutes twice daily  -ibuprofen ot tylenol per instructions if needed for pain  -home exercises 5-7 times per week: 1) start with circled exercises for 1 week -2) then add other exercises  -schedule follow up with Dr. Caryl Never  In 1 month

## 2012-12-10 ENCOUNTER — Other Ambulatory Visit: Payer: Self-pay

## 2012-12-10 DIAGNOSIS — Z1231 Encounter for screening mammogram for malignant neoplasm of breast: Secondary | ICD-10-CM

## 2012-12-26 ENCOUNTER — Ambulatory Visit: Payer: BC Managed Care – PPO | Admitting: Family Medicine

## 2013-01-06 ENCOUNTER — Ambulatory Visit: Payer: BC Managed Care – PPO

## 2013-01-06 ENCOUNTER — Ambulatory Visit
Admission: RE | Admit: 2013-01-06 | Discharge: 2013-01-06 | Disposition: A | Discharge status: Home / Self Care | Payer: BC Managed Care – PPO | Source: Ambulatory Visit

## 2013-01-06 DIAGNOSIS — Z1231 Encounter for screening mammogram for malignant neoplasm of breast: Secondary | ICD-10-CM

## 2013-01-28 ENCOUNTER — Other Ambulatory Visit: Payer: Self-pay | Admitting: Family Medicine

## 2013-02-21 ENCOUNTER — Other Ambulatory Visit: Payer: Self-pay

## 2013-02-21 MED ORDER — FUROSEMIDE 20 MG PO TABS: ORAL_TABLET | ORAL | Status: DC

## 2013-02-21 MED ORDER — ATORVASTATIN CALCIUM 10 MG PO TABS: 10.0000 mg | ORAL_TABLET | Freq: Every day | ORAL | Status: DC

## 2013-08-27 ENCOUNTER — Telehealth: Payer: Self-pay | Admitting: Family Medicine

## 2013-08-27 MED ORDER — ATORVASTATIN CALCIUM 10 MG PO TABS: 10.0000 mg | ORAL_TABLET | Freq: Every day | ORAL | Status: DC

## 2013-08-27 NOTE — Telephone Encounter (Signed)
RX sent to pharmacy  

## 2013-08-27 NOTE — Telephone Encounter (Signed)
[id number] [place], [place] - [address] is requesting re-fill on atorvastatin (LIPITOR) 10 MG tablet

## 2013-10-02 ENCOUNTER — Other Ambulatory Visit: Payer: Self-pay | Admitting: Family Medicine

## 2013-10-02 NOTE — Telephone Encounter (Signed)
Pt has appt sch for 10-21-13. Pt will come in fasting

## 2013-10-02 NOTE — Telephone Encounter (Signed)
Pt has one pill left

## 2013-10-21 ENCOUNTER — Encounter: Payer: Self-pay | Admitting: Family Medicine

## 2013-10-21 ENCOUNTER — Ambulatory Visit (INDEPENDENT_AMBULATORY_CARE_PROVIDER_SITE_OTHER): Payer: BC Managed Care – PPO | Admitting: Family Medicine

## 2013-10-21 VITALS — BP 118/80 | HR 71 | Wt 179.0 lb

## 2013-10-21 DIAGNOSIS — R3915 Urgency of urination: Secondary | ICD-10-CM | POA: Insufficient documentation

## 2013-10-21 DIAGNOSIS — M2550 Pain in unspecified joint: Secondary | ICD-10-CM

## 2013-10-21 DIAGNOSIS — E785 Hyperlipidemia, unspecified: Secondary | ICD-10-CM

## 2013-10-21 LAB — LIPID PANEL
CHOLESTEROL: 140 mg/dL (ref 0–200)
HDL: 50.8 mg/dL (ref >39.00)
LDL Cholesterol: 77 mg/dL (ref 0–99)
Total CHOL/HDL Ratio: 3
Triglycerides: 61 mg/dL (ref 0.0–149.0)
VLDL: 12.2 mg/dL (ref 0.0–40.0)

## 2013-10-21 LAB — HEPATIC FUNCTION PANEL
ALK PHOS: 74 U/L (ref 39–117)
ALT: 22 U/L (ref 0–35)
AST: 20 U/L (ref 0–37)
Albumin: 4 g/dL (ref 3.5–5.2)
BILIRUBIN DIRECT: 0.1 mg/dL (ref 0.0–0.3)
TOTAL PROTEIN: 6.8 g/dL (ref 6.0–8.3)
Total Bilirubin: 1.2 mg/dL (ref 0.2–1.2)

## 2013-10-21 LAB — BASIC METABOLIC PANEL
BUN: 12 mg/dL (ref 6–23)
CALCIUM: 9.4 mg/dL (ref 8.4–10.5)
CO2: 26 mEq/L (ref 19–32)
CREATININE: 0.8 mg/dL (ref 0.4–1.2)
Chloride: 108 mEq/L (ref 96–112)
GFR: 80.56 mL/min (ref >60.00)
Glucose, Bld: 92 mg/dL (ref 70–99)
Potassium: 3.9 mEq/L (ref 3.5–5.1)
Sodium: 140 mEq/L (ref 135–145)

## 2013-10-21 MED ORDER — MELOXICAM 15 MG PO TABS: 15.0000 mg | ORAL_TABLET | Freq: Every day | ORAL | Status: DC

## 2013-10-21 MED ORDER — ATORVASTATIN CALCIUM 10 MG PO TABS: 10.0000 mg | ORAL_TABLET | Freq: Every day | ORAL | Status: DC

## 2013-10-21 NOTE — Patient Instructions (Signed)
Osteoarthritis Osteoarthritis is a disease that causes soreness and swelling (inflammation) of a joint. It occurs when the cartilage at the affected joint wears down. Cartilage acts as a cushion, covering the ends of bones where they meet to form a joint. Osteoarthritis is the most common form of arthritis. It often occurs in older people. The joints affected most often by this condition include those in the:  Ends of the fingers.  Thumbs.  Neck.  Lower back.  Knees.  Hips. CAUSES  Over time, the cartilage that covers the ends of bones begins to wear away. This causes bone to rub on bone, producing pain and stiffness in the affected joints.  RISK FACTORS Certain factors can increase your chances of having osteoarthritis, including:  Older age.  Excessive body weight.  Overuse of joints. SIGNS AND SYMPTOMS   Pain, swelling, and stiffness in the joint.  Over time, the joint may lose its normal shape.  Small deposits of bone (osteophytes) may grow on the edges of the joint.  Bits of bone or cartilage can break off and float inside the joint space. This may cause more pain and damage. DIAGNOSIS  Your health care provider will do a physical exam and ask about your symptoms. Various tests may be ordered, such as:  X-rays of the affected joint.  An MRI scan.  Blood tests to rule out other types of arthritis.  Joint fluid tests. This involves using a needle to draw fluid from the joint and examining the fluid under a microscope. TREATMENT  Goals of treatment are to control pain and improve joint function. Treatment plans may include:  A prescribed exercise program that allows for rest and joint relief.  A weight control plan.  Pain relief techniques, such as:  Properly applied heat and cold.  Electric pulses delivered to nerve endings under the skin (transcutaneous electrical nerve stimulation, TENS).  Massage.  Certain nutritional supplements.  Medicines to  control pain, such as:  Acetaminophen.  Nonsteroidal anti-inflammatory drugs (NSAIDs), such as naproxen.  Narcotic or central-acting agents, such as tramadol.  Corticosteroids. These can be given orally or as an injection.  Surgery to reposition the bones and relieve pain (osteotomy) or to remove loose pieces of bone and cartilage. Joint replacement may be needed in advanced states of osteoarthritis. HOME CARE INSTRUCTIONS   Only take over-the-counter or prescription medicines as directed by your health care provider. Take all medicines exactly as instructed.  Maintain a healthy weight. Follow your health care provider's instructions for weight control. This may include dietary instructions.  Exercise as directed. Your health care provider can recommend specific types of exercise. These may include:  Strengthening exercises These are done to strengthen the muscles that support joints affected by arthritis. They can be performed with weights or with exercise bands to add resistance.  Aerobic activities These are exercises, such as brisk walking or low-impact aerobics, that get your heart pumping.  Range-of-motion activities These keep your joints limber.  Balance and agility exercises These help you maintain daily living skills.  Rest your affected joints as directed by your health care provider.  Follow up with your health care provider as directed. SEEK MEDICAL CARE IF:   Your skin turns red.  You develop a rash in addition to your joint pain.  You have worsening joint pain. SEEK IMMEDIATE MEDICAL CARE IF:  You have a significant loss of weight or appetite.  You have a fever along with joint or muscle aches.  You have   night sweats. FOR MORE INFORMATION  National Institute of Arthritis and Musculoskeletal and Skin Diseases: www.niams.nih.gov National Institute on Aging: www.nia.nih.gov American College of Rheumatology: www.rheumatology.org Document Released: 05/08/2005  Document Revised: 02/26/2013 Document Reviewed: 01/13/2013 Exit[employer] Patient Information 2014 [employer].  

## 2013-10-21 NOTE — Progress Notes (Signed)
Pre visit review using our clinic review tool, if applicable. No additional management support is needed unless otherwise documented below in the visit note. 

## 2013-10-21 NOTE — Progress Notes (Signed)
   Subjective:    Patient ID: Samantha Mayer, female    DOB: 1955/10/30, 58 y.o.   MRN: [id number]  HPI Multiple issues:  Hyperlipidemia treated with atorvastatin. No history of CAD but does have family history of CAD with a brother who had MI age 40. Patient's had no chest pain. Compliant with therapy. Due for lab work. She has history of some peripheral edema and takes Lasix but only with travel. No history of hypertension  Arthralgias involving multiple joints. Mostly DIP joint and PIP joints of the hands. She also some arthritic pain involving right knee. She has taken occasional naproxen without much improvement. No signs of inflammation.  Urinary urgency. She takes tropsium chloride 20 mg.  Symptoms are adequately controlled with that.  Past Medical History  Diagnosis Date  . Nipple discharge     bloody in lft  . Papilloma of breast     lft  . Hearing loss   . Urgency incontinence   . Hyperlipidemia     borderline  . Seasonal allergies   . No pertinent past medical history    Past Surgical History  Procedure Laterality Date  . Tonsillectomy    . Arcuate keratectomy  1987  . Breast papilloma excision  2006    left  . Abdominal hysterectomy  1993    Leiomyomas  . Eye surgery      karatotomy  . Breast ductal system excision  10/27/2011    Procedure: EXCISION DUCTAL SYSTEM BREAST;  Surgeon: Haywood Lasso, MD;  Location: [facility];  Service: General;  Laterality: Left;    reports that she quit smoking about 22 years ago. She has never used smokeless tobacco. She reports that she drinks alcohol. She reports that she does not use illicit drugs. family history includes COPD in her father; Heart disease in her father; Heart disease (age of onset: 21) in her brother; Hypertension in her mother. There is no history of Colon cancer. Allergies  Allergen Reactions  . Codeine     As child  . Sulfonamide Derivatives     REACTION: rash, swelling      Review  of Systems  Constitutional: Negative for fatigue.  Eyes: Negative for visual disturbance.  Respiratory: Negative for cough, chest tightness, shortness of breath and wheezing.   Cardiovascular: Negative for chest pain, palpitations and leg swelling.  Genitourinary: Positive for urgency. Negative for dysuria and difficulty urinating.  Musculoskeletal: Positive for arthralgias.  Neurological: Negative for dizziness, seizures, syncope, weakness, light-headedness and headaches.       Objective:   Physical Exam  Constitutional: She appears well-developed and well-nourished.  Neck: Neck supple. No thyromegaly present.  Cardiovascular: Normal rate and regular rhythm.  Exam reveals no gallop.   No murmur heard. Pulmonary/Chest: Effort normal and breath sounds normal. No respiratory distress. She has no wheezes. She has no rales.  Musculoskeletal: She exhibits no edema.  Minimal crepitus with flexion-extension right knee. No effusion. She has some minimal thickening d IP and PIP joints of multiple digits of the hand.  Lymphadenopathy:    She has no cervical adenopathy.  Skin: No rash noted.          Assessment & Plan:  #1 hyperlipidemia. Recheck lipid and hepatic panel. Refill Lipitor for one year #2 urinary urgency. Refill regular medication for one year #3 osteoarthritis involving multiple joints. Trial of meloxicam 15 mg once daily. Review possible side effects. Take with food.

## 2013-10-30 ENCOUNTER — Other Ambulatory Visit: Payer: Self-pay | Admitting: Family Medicine

## 2014-01-13 ENCOUNTER — Other Ambulatory Visit: Payer: Self-pay

## 2014-01-13 DIAGNOSIS — Z1231 Encounter for screening mammogram for malignant neoplasm of breast: Secondary | ICD-10-CM

## 2014-01-20 ENCOUNTER — Ambulatory Visit
Admission: RE | Admit: 2014-01-20 | Discharge: 2014-01-20 | Disposition: A | Discharge status: Home / Self Care | Payer: BC Managed Care – PPO | Source: Ambulatory Visit

## 2014-01-20 DIAGNOSIS — Z1231 Encounter for screening mammogram for malignant neoplasm of breast: Secondary | ICD-10-CM

## 2014-07-28 ENCOUNTER — Other Ambulatory Visit: Payer: Self-pay | Admitting: Obstetrics and Gynecology

## 2014-07-29 LAB — CYTOLOGY - PAP

## 2015-03-10 ENCOUNTER — Other Ambulatory Visit: Payer: Self-pay

## 2015-03-10 DIAGNOSIS — Z1231 Encounter for screening mammogram for malignant neoplasm of breast: Secondary | ICD-10-CM

## 2015-03-23 ENCOUNTER — Ambulatory Visit
Admission: RE | Admit: 2015-03-23 | Discharge: 2015-03-23 | Disposition: A | Discharge status: Home / Self Care | Payer: BLUE CROSS/BLUE SHIELD | Source: Ambulatory Visit

## 2015-03-23 DIAGNOSIS — Z1231 Encounter for screening mammogram for malignant neoplasm of breast: Secondary | ICD-10-CM

## 2016-04-06 ENCOUNTER — Other Ambulatory Visit: Payer: Self-pay | Admitting: Obstetrics and Gynecology

## 2016-04-06 ENCOUNTER — Other Ambulatory Visit: Payer: Self-pay | Admitting: *Deleted

## 2016-04-06 DIAGNOSIS — Z1231 Encounter for screening mammogram for malignant neoplasm of breast: Secondary | ICD-10-CM

## 2016-04-27 ENCOUNTER — Ambulatory Visit
Admission: RE | Admit: 2016-04-27 | Discharge: 2016-04-27 | Disposition: A | Discharge status: Home / Self Care | Payer: BLUE CROSS/BLUE SHIELD | Source: Ambulatory Visit | Attending: Obstetrics and Gynecology | Admitting: Obstetrics and Gynecology

## 2016-04-27 DIAGNOSIS — Z1231 Encounter for screening mammogram for malignant neoplasm of breast: Secondary | ICD-10-CM

## 2017-03-27 ENCOUNTER — Other Ambulatory Visit: Payer: Self-pay | Admitting: *Deleted

## 2017-03-27 DIAGNOSIS — Z1231 Encounter for screening mammogram for malignant neoplasm of breast: Secondary | ICD-10-CM

## 2017-05-11 ENCOUNTER — Ambulatory Visit: Payer: BLUE CROSS/BLUE SHIELD

## 2017-05-11 ENCOUNTER — Ambulatory Visit
Admission: RE | Admit: 2017-05-11 | Discharge: 2017-05-11 | Disposition: A | Discharge status: Home / Self Care | Payer: Self-pay | Source: Ambulatory Visit | Attending: *Deleted | Admitting: *Deleted

## 2017-05-11 DIAGNOSIS — Z1231 Encounter for screening mammogram for malignant neoplasm of breast: Secondary | ICD-10-CM

## 2017-11-14 ENCOUNTER — Emergency Department
Admission: EM | Admit: 2017-11-14 | Discharge: 2017-11-14 | Discharge status: Absent without leave | Payer: BLUE CROSS/BLUE SHIELD | Attending: Emergency Medicine | Admitting: Emergency Medicine

## 2017-11-14 NOTE — ED Triage Notes (Signed)
Arrives to ED by [facility] for medical eval. Pt was found outside of someone's house in gown and not making any sense. Pt denies pain. Pt with flight of thoughts.

## 2019-03-13 IMAGING — MG DIGITAL SCREENING BILATERAL MAMMOGRAM WITH CAD
4 series · 4 of 4 positions shown · non-contrast
Comparison: Previous exam(s).

CLINICAL DATA: Screening.

EXAM:
DIGITAL SCREENING BILATERAL MAMMOGRAM WITH CAD

[R MLO]
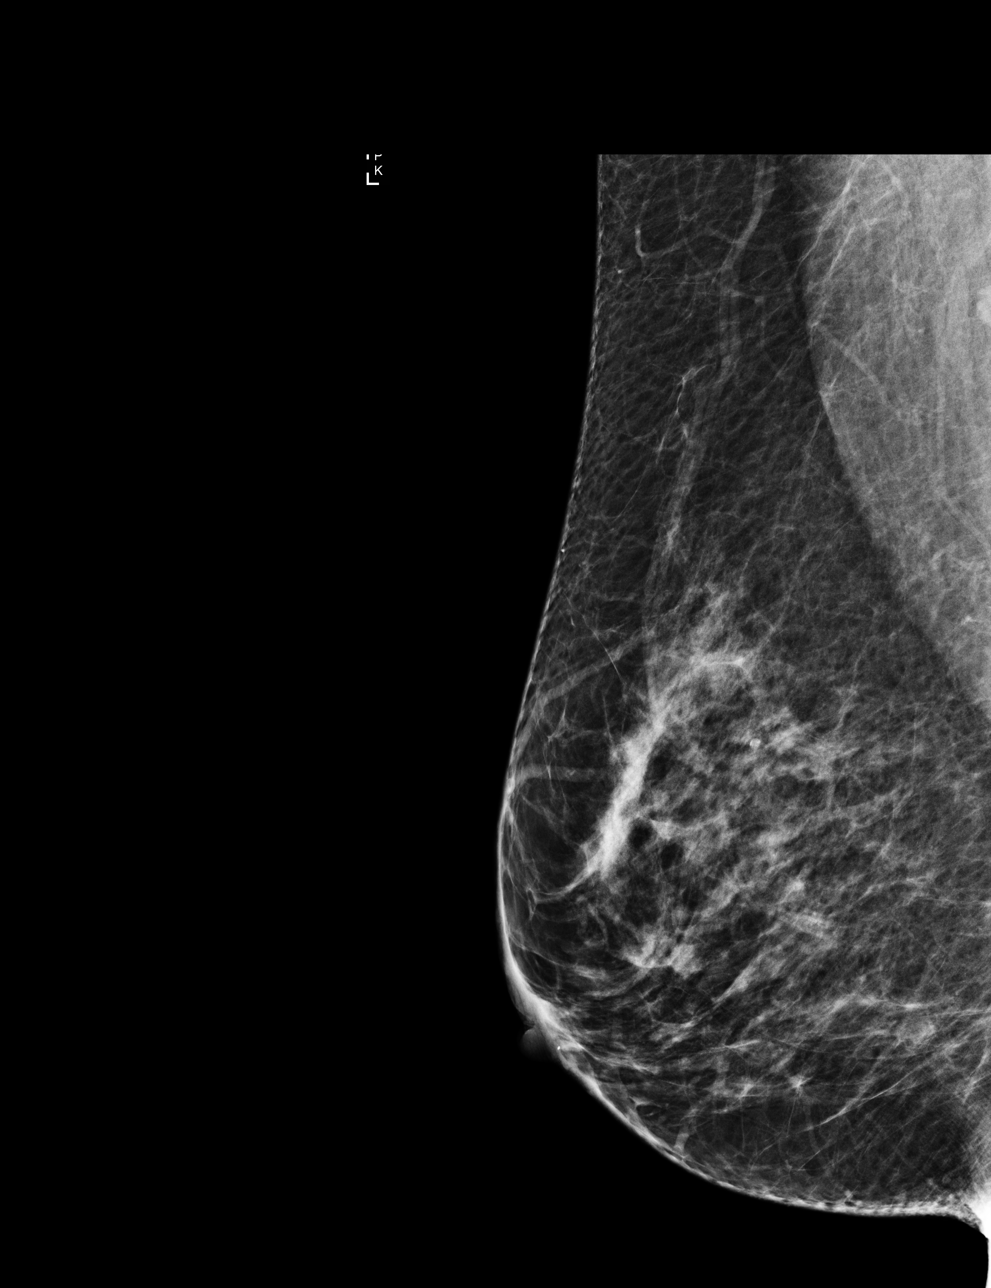

[L MLO]
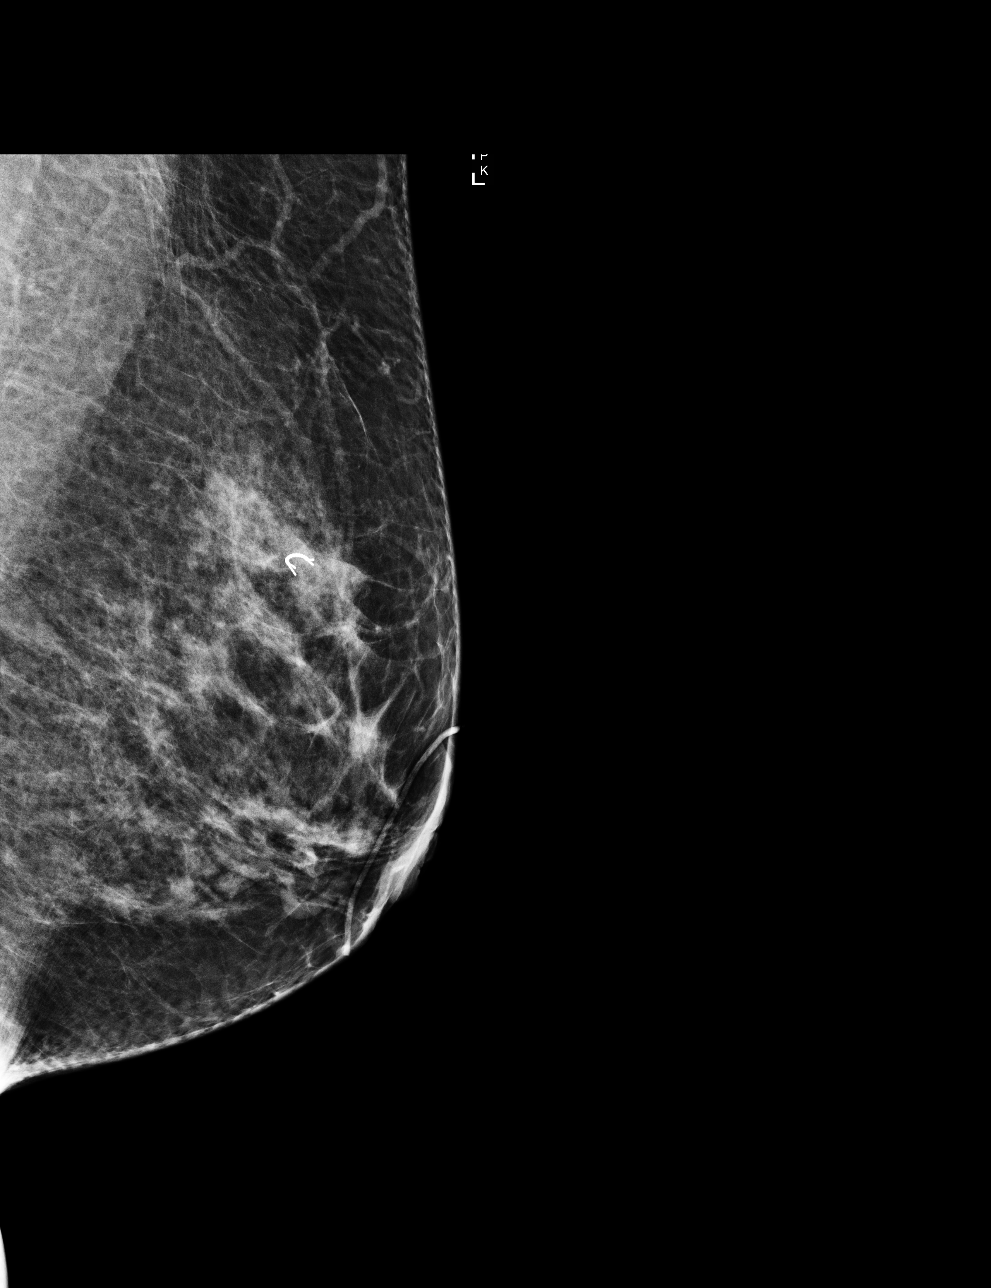

[R CC]
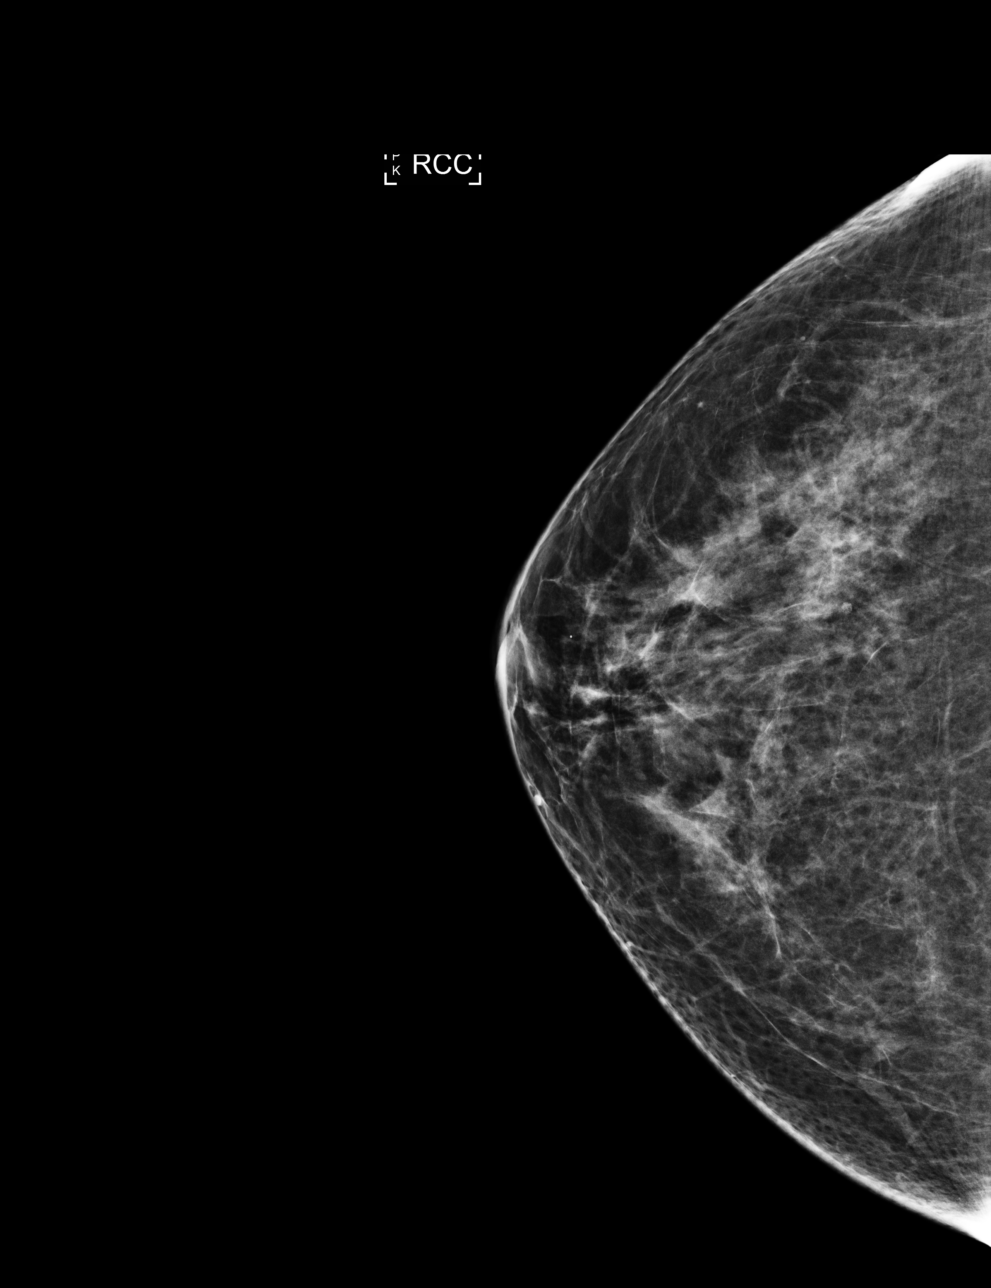

[L CC]
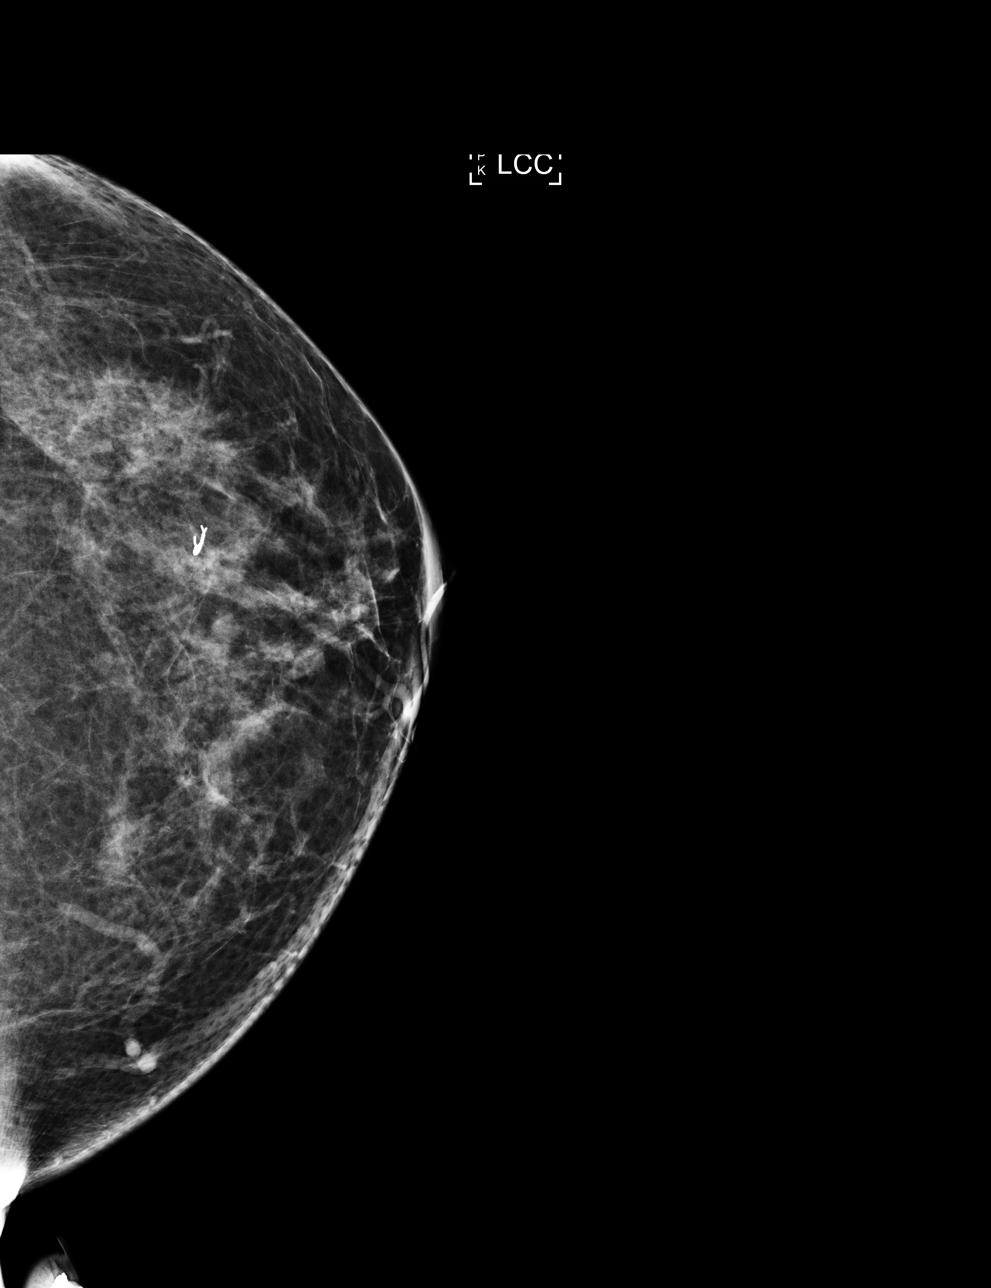

[4 of 4 positions shown; findings below may reference images not displayed]

ACR Breast Density Category c: The breast tissue is heterogeneously
dense, which may obscure small masses.
FINDINGS: There are no findings suspicious for malignancy. Images were
processed with CAD.
IMPRESSION: No mammographic evidence of malignancy. A result letter of this
screening mammogram will be mailed directly to the patient.

RECOMMENDATION:
Screening mammogram in one year. (Code:[id number])

BI-RADS CATEGORY  1: Negative.

## 2021-02-22 ENCOUNTER — Other Ambulatory Visit (HOSPITAL_BASED_OUTPATIENT_CLINIC_OR_DEPARTMENT_OTHER): Payer: Self-pay

## 2021-02-22 ENCOUNTER — Ambulatory Visit: Payer: BLUE CROSS/BLUE SHIELD | Attending: Internal Medicine

## 2021-02-22 DIAGNOSIS — Z23 Encounter for immunization: Secondary | ICD-10-CM

## 2021-02-22 MED ORDER — PFIZER COVID-19 VAC BIVALENT 30 MCG/0.3ML IM SUSP
INTRAMUSCULAR | 0 refills | Status: DC
  Filled 2021-02-22: qty 0.3, 1d supply, fill #0

## 2021-02-22 NOTE — Progress Notes (Signed)
   Covid-19 Vaccination Clinic  Name:  Samantha Mayer    MRN: [id number] DOB: 01/04/1956  02/22/2021  Ms. Samantha Mayer was observed post Covid-19 immunization for 15 minutes without incident. She was provided with Vaccine Information Sheet and instruction to access the V-Safe system.   Ms. Samantha Mayer was instructed to call 911 with any severe reactions post vaccine: Difficulty breathing  Swelling of face and throat  A fast heartbeat  A bad rash all over body  Dizziness and weakness

## 2021-12-08 ENCOUNTER — Ambulatory Visit: Payer: Self-pay | Admitting: Family Medicine

## 2021-12-27 ENCOUNTER — Encounter: Payer: Self-pay | Admitting: Family Medicine

## 2021-12-27 ENCOUNTER — Ambulatory Visit: Payer: Medicare PPO | Admitting: Family Medicine

## 2021-12-27 VITALS — BP 132/78 | HR 68 | Temp 98.7°F | Ht 67.0 in | Wt 198.2 lb

## 2021-12-27 DIAGNOSIS — R3915 Urgency of urination: Secondary | ICD-10-CM | POA: Diagnosis not present

## 2021-12-27 DIAGNOSIS — E7800 Pure hypercholesterolemia, unspecified: Secondary | ICD-10-CM

## 2021-12-27 DIAGNOSIS — L719 Rosacea, unspecified: Secondary | ICD-10-CM | POA: Diagnosis not present

## 2021-12-27 DIAGNOSIS — D242 Benign neoplasm of left breast: Secondary | ICD-10-CM | POA: Diagnosis not present

## 2021-12-27 NOTE — Patient Instructions (Addendum)
Welcome to [facility]! It was a pleasure meeting you today.  As discussed, Please schedule a 6 month follow up visit today.  Check with pharmacy for Tdap immunization   PLEASE NOTE:  If you had any LAB tests please let us know if you have not heard back within a few days. You may see your results on MyChart before we have a chance to review them but we will give you a call once they are reviewed by [place]. If we ordered any REFERRALS today, please let us know if you have not heard from their office within the next week.  Let us know through MyChart if you are needing REFILLS, or have your pharmacy send [place] the request. You can also use MyChart to communicate with me or any office staff.  Please try these tips to maintain a healthy lifestyle:  Eat most of your calories during the day when you are active. Eliminate processed foods including packaged sweets (pies, cakes, cookies), reduce intake of potatoes, white bread, white pasta, and white rice. Look for whole grain options, oat flour or almond flour.  Each meal should contain half fruits/vegetables, one quarter protein, and one quarter carbs (no bigger than a computer mouse).  Cut down on sweet beverages. This includes juice, soda, and sweet tea. Also watch fruit intake, though this is a healthier sweet option, it still contains natural sugar! Limit to 3 servings daily.  Drink at least 1 glass of water with each meal and aim for at least 8 glasses per day  Exercise at least 150 minutes every week.

## 2021-12-27 NOTE — Progress Notes (Signed)
New Patient Office Visit  Subjective:  Patient ID: Samantha Mayer, female    DOB: Jan 01, 1956  Age: 66 y.o. MRN: [id number]  CC:  Chief Complaint  Patient presents with   Establish Care    Need new pcp Need a second mammogram Not fasting     HPI-from [place].  Here w/husb Renita Papa presents for new pt  FH Breast m aunt-dx 72.  Pt w/abn mamm.  Had duct removed from L in past.  No new nipple d/c. No symptoms.  Has Dx mamm/u/s sch on 8/10.  Rosacea-occ doxy FH CAD.  On atorvastatin '10mg'$  OAB-on oxybutinin-doing well.  H/o shingles L arm-when stressed, still can get "sensations" on L arm-rarely on R  Past Medical History:  Diagnosis Date   Allergy    Arthritis    Depression    Hearing loss    Hyperlipidemia    borderline   Nipple discharge    bloody in lft   Papilloma of breast    lft   Rosacea    Seasonal allergies    Urgency incontinence     Past Surgical History:  Procedure Laterality Date   ABDOMINAL HYSTERECTOMY  1994   Leiomyomas, partial   ARCUATE KERATECTOMY  1987   BREAST DUCTAL SYSTEM EXCISION  10/27/2011   Procedure: EXCISION DUCTAL SYSTEM BREAST;  Surgeon: Haywood Lasso, MD;  Location: [place];  Service: General;  Laterality: Left;   BREAST EXCISIONAL BIOPSY Left    benign   breast papilloma excision  2006   left   EYE SURGERY     karatotomy   TONSILLECTOMY  1963    Family History  Problem Relation Age of Onset   Hearing loss Mother    Arthritis Mother    Hypertension Mother    COPD Father    Heart disease Father 9   AAA (abdominal aortic aneurysm) Father    Heart attack Father    Heart disease Brother 22       CAD   Colon cancer Neg Hx     Social History   Socioeconomic History   Marital status: Married    Spouse name: Not on file   Number of children: 3   Years of education: Not on file   Highest education level: Not on file  Occupational History   Not on file  Tobacco Use   Smoking status:  Former    Packs/day: 1.00    Years: 12.00    Total pack years: 12.00    Types: Cigarettes    Quit date: 09/12/1991    Years since quitting: 30.3   Smokeless tobacco: Never  Vaping Use   Vaping Use: Never used  Substance and Sexual Activity   Alcohol use: Yes    Alcohol/week: 2.0 - 3.0 standard drinks of alcohol    Types: 1 - 2 Glasses of wine, 1 Shots of liquor per week    Comment: scotch   Drug use: Never   Sexual activity: Yes    Birth control/protection: Surgical  Other Topics Concern   Not on file  Social History Narrative   [phone]      Retired Pharmacist, hospital   Social Determinants of Radio broadcast assistant Strain: Not on file  Food Insecurity: Not on file  Transportation Needs: Not on file  Physical Activity: Not on file  Stress: Not on file  Social Connections: Not on file  Intimate Partner Violence: Not on file    ROS  ROS: Gen: no fever, chills  Skin: no rash, itching ENT: no ear pain, ear drainage, nasal congestion, rhinorrhea, sinus pressure, sore throat.  HOH-wearing hearing aids. Eyes: no blurry vision, double vision Resp: no cough, wheeze,SOB.  Whole life-cold air and coughs. CV: no CP, palpitations, LE edema,  varicose veins-has seen vascular surg. GI: no heartburn, n/v/d/c, abd pain GU: no dysuria, urgency, frequency, hematuria MSK: R knee-does water classes.   Neuro: no dizziness, headache, weakness, vertigo Psych: no depression, anxiety, insomnia, SI   Objective:   Today's Vitals: BP 132/78   Pulse 68   Temp 98.7 F (37.1 C) (Temporal)   Ht '5\' 7"'$  (1.702 m)   Wt 198 lb 4 oz (89.9 kg)   SpO2 96%   BMI 31.05 kg/m   Physical Exam  Gen: WDWN NAD HEENT: NCAT, conjunctiva not injected, sclera nonicteric TM WNL B, OP moist, no exudates  NECK:  supple, no thyromegaly, no nodes, no carotid bruits CARDIAC: RRR, S1S2+, no murmur. DP 2+B LUNGS: CTAB. No wheezes ABDOMEN:  BS+, soft, NTND, No HSM, no masses EXT:  no edema. Some varicose veins MSK:  no gross abnormalities.  NEURO: A&O x3.  CN II-XII intact.  PSYCH: normal mood. Good eye contact   Assessment & Plan:   Problem List Items Addressed This Visit       Musculoskeletal and Integument   Rosacea     Other   Intraductal papilloma of left  breast   Urinary urgency   Pure hypercholesterolemia - Primary   Relevant Orders   Comprehensive metabolic panel   Lipid panel   TSH  1.  Intraductal papilloma of left breast-chronic.  Recent mammogram showed abnormality.  She is scheduled on August 10 for diagnostic mammogram and ultrasound. 2.  Hyperlipidemia with family history of coronary artery disease-chronic.  Stable.  Continue atorvastatin 10 mg daily.  Check lipids and CMP and TSH (patient will return) 3.  Urinary urgency/overactive bladder-chronic.  Well-controlled on oxybutynin XL 10 mg.  Continue. 4.  Rosacea-chronic.  Controlled.  Occasionally takes doxycycline  Patient told me at the end of her and her husband's new patient visits, they are moving to [place] in approximately 1 month.  I advised to let me know if she needs any refills, and she will return to get labs done before she leaves.  Outpatient Encounter Medications as of 12/27/2021  Medication Sig   atorvastatin (LIPITOR) 10 MG tablet TAKE 1 TABLET BY MOUTH EVERY DAY   omeprazole (PRILOSEC) 20 MG capsule Take 1 capsule by mouth daily.   oxybutynin (DITROPAN-XL) 10 MG 24 hr tablet Take 10 mg by mouth daily.   [DISCONTINUED] aspirin 81 MG tablet Take 81 mg by mouth daily.     [DISCONTINUED] atorvastatin (LIPITOR) 10 MG tablet Take 1 tablet (10 mg total) by mouth daily at 6 PM.   [DISCONTINUED] COVID-19 mRNA bivalent vaccine, Pfizer, (PFIZER COVID-19 VAC BIVALENT) injection Inject into the muscle.   [DISCONTINUED] diphenhydrAMINE (BENADRYL) 25 MG tablet Take 25 mg by mouth at bedtime.   [DISCONTINUED] furosemide (LASIX) 20 MG tablet TAKE 1 TABLET BY MOUTH EVERY DAY AS NEEDED FOR LEG EDEMA   [DISCONTINUED] meloxicam  (MOBIC) 15 MG tablet Take 1 tablet (15 mg total) by mouth daily.   [DISCONTINUED] NON FORMULARY Tropsium Chloride 20 mg.  Take 1 tablet once daily.   [DISCONTINUED] Zoster Vaccine Adjuvanted [facility]) injection ADM 0.5ML IM UTD (Patient not taking: Reported on 12/27/2021)   No facility-administered encounter medications on file as of 12/27/2021.  Follow-up: Return in about 6 months (around 06/29/2022) for hld-6 mo me,    soon fasting for labs.   Wellington Hampshire, MD

## 2021-12-28 ENCOUNTER — Other Ambulatory Visit (INDEPENDENT_AMBULATORY_CARE_PROVIDER_SITE_OTHER): Payer: Medicare PPO

## 2021-12-28 DIAGNOSIS — E7800 Pure hypercholesterolemia, unspecified: Secondary | ICD-10-CM | POA: Diagnosis not present

## 2021-12-28 LAB — COMPREHENSIVE METABOLIC PANEL
ALT: 26 U/L (ref 0–35)
AST: 21 U/L (ref 0–37)
Albumin: 4.2 g/dL (ref 3.5–5.2)
Alkaline Phosphatase: 93 U/L (ref 39–117)
BUN: 16 mg/dL (ref 6–23)
CO2: 27 mEq/L (ref 19–32)
Calcium: 9.6 mg/dL (ref 8.4–10.5)
Chloride: 104 mEq/L (ref 96–112)
Creatinine, Ser: 0.79 mg/dL (ref 0.40–1.20)
GFR: 77.96 mL/min (ref >60.00)
Glucose, Bld: 98 mg/dL (ref 70–99)
Potassium: 3.9 mEq/L (ref 3.5–5.1)
Sodium: 139 mEq/L (ref 135–145)
Total Bilirubin: 1.3 mg/dL — ABNORMAL HIGH (ref 0.2–1.2)
Total Protein: 6.7 g/dL (ref 6.0–8.3)

## 2021-12-28 LAB — TSH: TSH: 2.61 u[IU]/mL (ref 0.35–5.50)

## 2021-12-28 LAB — LIPID PANEL
Cholesterol: 192 mg/dL (ref 0–200)
HDL: 51.4 mg/dL (ref >39.00)
LDL Cholesterol: 120 mg/dL — ABNORMAL HIGH (ref 0–99)
NonHDL: 140.31
Total CHOL/HDL Ratio: 4
Triglycerides: 104 mg/dL (ref 0.0–149.0)
VLDL: 20.8 mg/dL (ref 0.0–40.0)

## 2021-12-29 ENCOUNTER — Telehealth: Payer: Self-pay | Admitting: Family Medicine

## 2021-12-29 ENCOUNTER — Other Ambulatory Visit: Payer: Self-pay | Admitting: *Deleted

## 2021-12-29 MED ORDER — ATORVASTATIN CALCIUM 20 MG PO TABS: 20.0000 mg | ORAL_TABLET | Freq: Every day | ORAL | 3 refills | Status: DC

## 2021-12-29 NOTE — Telephone Encounter (Signed)
New rx for atorvastatin 20 mg sent to pharmacy. Patient notified and verbalized understanding.

## 2021-12-29 NOTE — Telephone Encounter (Signed)
Patient states she has read Dr. Gertie Fey message in regard to increasing her atorvastatin.  States she just picked up a 90 day supply of this medcation.  States this will only last her 1 and 1/2 months.  States she is going out of town for an extended period of time on 9/19 and is requesting a new script to be sent the her pharmacy ([facility] on [address]).

## 2021-12-30 ENCOUNTER — Encounter: Payer: Self-pay | Admitting: Family Medicine

## 2022-01-02 ENCOUNTER — Encounter: Payer: Self-pay | Admitting: Family Medicine

## 2022-01-02 NOTE — Telephone Encounter (Signed)
Results placed on providers desk for review.

## 2022-01-04 ENCOUNTER — Other Ambulatory Visit: Payer: Self-pay | Admitting: *Deleted

## 2022-01-04 DIAGNOSIS — R928 Other abnormal and inconclusive findings on diagnostic imaging of breast: Secondary | ICD-10-CM

## 2022-01-09 ENCOUNTER — Encounter: Payer: Self-pay | Admitting: Family Medicine

## 2022-01-19 ENCOUNTER — Other Ambulatory Visit: Payer: Self-pay | Admitting: General Surgery

## 2022-01-19 DIAGNOSIS — N6321 Unspecified lump in the left breast, upper outer quadrant: Secondary | ICD-10-CM

## 2022-01-25 ENCOUNTER — Ambulatory Visit
Admission: RE | Admit: 2022-01-25 | Discharge: 2022-01-25 | Disposition: A | Discharge status: Home / Self Care | Payer: Medicare PPO | Source: Ambulatory Visit | Attending: General Surgery | Admitting: General Surgery

## 2022-01-25 DIAGNOSIS — N6321 Unspecified lump in the left breast, upper outer quadrant: Secondary | ICD-10-CM

## 2022-02-24 ENCOUNTER — Telehealth: Payer: Self-pay | Admitting: Family Medicine

## 2022-02-24 NOTE — Telephone Encounter (Signed)
Copied from [place] [id number]. Topic: Medicare AWV >> Feb 24, 2022  2:34 PM Devoria Glassing wrote: Reason for CRM: Called patient to schedule Annual Wellness Visit.  Please schedule with Nurse Health Advisor Charlott Rakes, RN at [facility]. This appt can be telephone or office visit. Please call [phone] ask for [facility]  No voicemail

## 2022-05-04 ENCOUNTER — Encounter: Payer: Self-pay | Admitting: *Deleted

## 2022-08-14 ENCOUNTER — Telehealth: Payer: Self-pay | Admitting: Family Medicine

## 2022-08-14 NOTE — Telephone Encounter (Signed)
Copied from [place] [id number]. Topic: Medicare AWV >> Aug 14, 2022  9:46 AM Gillis Santa wrote: Reason for CRM: Called patient to schedule Medicare Annual Wellness Visit (AWV). Left message for patient to call back and schedule Medicare Annual Wellness Visit (AWV).  Last date of AWV: N/A  Please schedule an appointment at any time with Otila Kluver, [facility].  Please schedule AWVI with Otila Kluver, [place].  If any questions, please contact me at [phone].  Thank you ,  Shaune Pollack [facility] AWV TEAM Direct Dial [phone]

## 2022-08-21 ENCOUNTER — Other Ambulatory Visit: Payer: Self-pay | Admitting: Family Medicine

## 2022-08-21 ENCOUNTER — Ambulatory Visit (INDEPENDENT_AMBULATORY_CARE_PROVIDER_SITE_OTHER): Payer: Medicare PPO

## 2022-08-21 ENCOUNTER — Ambulatory Visit: Payer: Medicare PPO | Admitting: Family Medicine

## 2022-08-21 ENCOUNTER — Encounter: Payer: Self-pay | Admitting: Family Medicine

## 2022-08-21 VITALS — BP 112/71 | Wt 201.0 lb

## 2022-08-21 VITALS — BP 112/71 | HR 71 | Temp 98.2°F | Ht 67.0 in | Wt 201.4 lb

## 2022-08-21 DIAGNOSIS — E559 Vitamin D deficiency, unspecified: Secondary | ICD-10-CM | POA: Diagnosis not present

## 2022-08-21 DIAGNOSIS — M159 Polyosteoarthritis, unspecified: Secondary | ICD-10-CM

## 2022-08-21 DIAGNOSIS — K219 Gastro-esophageal reflux disease without esophagitis: Secondary | ICD-10-CM

## 2022-08-21 DIAGNOSIS — R3915 Urgency of urination: Secondary | ICD-10-CM

## 2022-08-21 DIAGNOSIS — M549 Dorsalgia, unspecified: Secondary | ICD-10-CM

## 2022-08-21 DIAGNOSIS — Z0000 Encounter for general adult medical examination without abnormal findings: Secondary | ICD-10-CM | POA: Diagnosis not present

## 2022-08-21 DIAGNOSIS — E7800 Pure hypercholesterolemia, unspecified: Secondary | ICD-10-CM

## 2022-08-21 MED ORDER — DICLOFENAC SODIUM 1 % EX GEL: 4.0000 g | Freq: Four times a day (QID) | CUTANEOUS | 3 refills | Status: DC | PRN

## 2022-08-21 MED ORDER — OXYBUTYNIN CHLORIDE ER 10 MG PO TB24: 10.0000 mg | ORAL_TABLET | Freq: Every day | ORAL | 3 refills | Status: DC

## 2022-08-21 MED ORDER — ATORVASTATIN CALCIUM 20 MG PO TABS
20.0000 mg | ORAL_TABLET | Freq: Every day | ORAL | 3 refills | Status: DC
Start: 2022-08-21 — End: 2023-06-11

## 2022-08-21 MED ORDER — OMEPRAZOLE MAGNESIUM 20 MG PO TBEC: 20.0000 mg | DELAYED_RELEASE_TABLET | Freq: Every day | ORAL | 3 refills | Status: DC

## 2022-08-21 NOTE — Patient Instructions (Addendum)
Foam roller for back.  Heat, massage.  Celoxicab.

## 2022-08-21 NOTE — Progress Notes (Signed)
Subjective:   Samantha Mayer is a 67 y.o. female who presents for an Initial Medicare Annual Wellness Visit.  Review of Systems     Cardiac Risk Factors include: advanced age (>85men, >77 women);obesity (BMI >30kg/m2);dyslipidemia     Objective:    Today's Vitals   08/21/22 1531  BP: 112/71  Weight: 201 lb (91.2 kg)   Body mass index is 31.48 kg/m.     08/21/2022    3:34 PM 10/25/2011   11:17 AM 09/12/2011    3:49 PM  Advanced Directives  Does Patient Have a Medical Advance Directive? Yes Patient does not have advance directive Patient does not have advance directive  Type of Advance Directive [place];Living will    Copy of [place] in Chart? No - copy requested      Current Medications (verified) Outpatient Encounter Medications as of 08/21/2022  Medication Sig   atorvastatin (LIPITOR) 20 MG tablet Take 1 tablet (20 mg total) by mouth daily.   diclofenac Sodium (VOLTAREN) 1 % GEL Apply 4 g topically 4 (four) times daily as needed.   omeprazole (PRILOSEC OTC) 20 MG tablet Take 1 tablet (20 mg total) by mouth daily.   oxybutynin (DITROPAN-XL) 10 MG 24 hr tablet Take 1 tablet (10 mg total) by mouth daily.   No facility-administered encounter medications on file as of 08/21/2022.    Allergies (verified) Codeine, Sulfa antibiotics, Sulfamethoxazole, and Sulfonamide derivatives   History: Past Medical History:  Diagnosis Date   Allergy    Arthritis    Depression    Hearing loss    Hyperlipidemia    borderline   Nipple discharge    bloody in lft   Papilloma of breast    lft   Rosacea    Seasonal allergies    Urgency incontinence    Past Surgical History:  Procedure Laterality Date   ABDOMINAL HYSTERECTOMY  1994   Leiomyomas, partial   ARCUATE KERATECTOMY  1987   BREAST DUCTAL SYSTEM EXCISION  10/27/2011   Procedure: EXCISION DUCTAL SYSTEM BREAST;  Surgeon: Haywood Lasso, MD;  Location: [place];   Service: General;  Laterality: Left;   BREAST EXCISIONAL BIOPSY Left    benign   breast papilloma excision  2006   left   EYE SURGERY     karatotomy   TONSILLECTOMY  1963   Family History  Problem Relation Age of Onset   Hearing loss Mother    Arthritis Mother    Hypertension Mother    COPD Father    Heart disease Father 31   AAA (abdominal aortic aneurysm) Father    Heart attack Father    Heart disease Brother 68       CAD   Colon cancer Neg Hx    Social History   Socioeconomic History   Marital status: Married    Spouse name: Not on file   Number of children: 3   Years of education: Not on file   Highest education level: Not on file  Occupational History   Not on file  Tobacco Use   Smoking status: Former    Packs/day: 1.00    Years: 12.00    Additional pack years: 0.00    Total pack years: 12.00    Types: Cigarettes    Quit date: 09/12/1991    Years since quitting: 30.9   Smokeless tobacco: Never  Vaping Use   Vaping Use: Never used  Substance and Sexual Activity  Alcohol use: Yes    Alcohol/week: 2.0 - 3.0 standard drinks of alcohol    Types: 1 - 2 Glasses of wine, 1 Shots of liquor per week    Comment: scotch   Drug use: Never   Sexual activity: Yes    Birth control/protection: Surgical  Other Topics Concern   Not on file  Social History Narrative   [phone]      Retired Pharmacist, hospital   Social Determinants of Health   Financial Resource Strain: [place]  (08/21/2022)   Overall Financial Resource Strain (CARDIA)    Difficulty of Paying Living Expenses: Not hard at all  Food Insecurity: No Food Insecurity (08/21/2022)   Hunger Vital Sign    Worried About Running Out of Food in the Last Year: Never true    [place] in the Last Year: Never true  Transportation Needs: No Transportation Needs (08/21/2022)   PRAPARE - Hydrologist (Medical): No    Lack of Transportation (Non-Medical): No  Physical Activity: Inactive  (08/21/2022)   Exercise Vital Sign    Days of Exercise per Week: 0 days    Minutes of Exercise per Session: 0 min  Stress: No Stress Concern Present (08/21/2022)   [place]    Feeling of Stress : Not at all  Social Connections: Moderately Integrated (08/21/2022)   Social Connection and Isolation Panel [NHANES]    Frequency of Communication with Friends and Family: More than three times a week    Frequency of Social Gatherings with Friends and Family: More than three times a week    Attends Religious Services: More than 4 times per year    Active Member of [employer] or Organizations: No    Attends Music therapist: Never    Marital Status: Married    Tobacco Counseling Counseling given: Not Answered   Clinical Intake:  Pre-visit preparation completed: Yes  Pain : No/denies pain     BMI - recorded: 31.48 Nutritional Status: BMI > 30  Obese Nutritional Risks: None Diabetes: No  How often do you need to have someone help you when you read instructions, pamphlets, or other written materials from your doctor or pharmacy?: 1 - Never  Diabetic?no  Interpreter Needed?: No  Information entered by :: Charlott Rakes, LPN   Activities of Daily Living    08/21/2022    3:35 PM  In your present state of health, do you have any difficulty performing the following activities:  Hearing? 1  Comment wears hearing issues  Vision? 0  Difficulty concentrating or making decisions? 0  Walking or climbing stairs? 0  Dressing or bathing? 0  Doing errands, shopping? 0  Preparing Food and eating ? N  Using the Toilet? N  In the past six months, have you accidently leaked urine? N  Do you have problems with loss of bowel control? N  Managing your Medications? N  Managing your Finances? N  Housekeeping or managing your Housekeeping? N    Patient Care Team: Tawnya Crook, MD as PCP - General (Family  Medicine)  Indicate any recent Medical Services you may have received from other than [facility] providers in the past year (date may be approximate).     Assessment:   This is a routine wellness examination for [facility].  Hearing/Vision screen Hearing Screening - Comments:: Wears hearing aids  Vision Screening - Comments:: Pt follows up with [place] [facility]   Dietary  issues and exercise activities discussed: Current Exercise Habits: The patient does not participate in regular exercise at present   Goals Addressed             This Visit's Progress    Patient Stated       Get back to routine exercsie       Depression Screen    08/21/2022    3:33 PM 12/27/2021   11:21 AM  PHQ 2/9 Scores  PHQ - 2 Score 0 0    Fall Risk    08/21/2022    3:35 PM 08/21/2022    1:44 PM 12/27/2021   11:21 AM  [place] in the past year? 0 0 0  Number falls in past yr: 0 0 0  Injury with Fall? 0 0 0  Risk for fall due to : Impaired vision No Fall Risks No Fall Risks  Follow up Falls prevention discussed Falls evaluation completed Falls evaluation completed    [place]:  Any stairs in or around the home? No  If so, are there any without handrails? No  Home free of loose throw rugs in walkways, pet beds, electrical cords, etc? Yes  Adequate lighting in your home to reduce risk of falls? Yes   ASSISTIVE DEVICES UTILIZED TO PREVENT FALLS:  Life alert? No  Use of a cane, walker or w/c? No  Grab bars in the bathroom? Yes  Shower chair or bench in shower? Yes  Elevated toilet seat or a handicapped toilet? No   TIMED UP AND GO:  Was the test performed? Yes .  Length of time to ambulate 10 feet: 10 sec.   Gait steady and fast without use of assistive device  Cognitive Function:        08/21/2022    3:35 PM  6CIT Screen  What Year? 0 points  What month? 0 points  What time? 0 points  Count back from 20 0 points  Months in reverse 0 points  Repeat  phrase 0 points  Total Score 0 points    Immunizations Immunization History  Administered Date(s) Administered   Influenza Split 02/22/2011   Influenza, High Dose Seasonal PF 04/26/2021   Influenza, Seasonal, Injecte, Preservative Fre 03/08/2018, 04/02/2019   Influenza,inj,Quad PF,6+ Mos 03/27/2016, 07/08/2018   PFIZER Comirnaty(Gray Top)Covid-19 Tri-Sucrose Vaccine 10/26/2020   PNEUMOCOCCAL CONJUGATE-20 04/26/2021   Pfizer Covid-19 Vaccine Bivalent Booster 59yrs & up 02/22/2021   Td 05/22/2005   Td (Adult),5 Lf Tetanus Toxid, Preservative Free 05/22/2005   Zoster Recombinat (Shingrix) 10/03/2017, 04/08/2018    TDAP status: Due, Education has been provided regarding the importance of this vaccine. Advised may receive this vaccine at local pharmacy or Health Dept. Aware to provide a copy of the vaccination record if obtained from local pharmacy or Health Dept. Verbalized acceptance and understanding.  Flu Vaccine status: Declined, Education has been provided regarding the importance of this vaccine but patient still declined. Advised may receive this vaccine at local pharmacy or Health Dept. Aware to provide a copy of the vaccination record if obtained from local pharmacy or Health Dept. Verbalized acceptance and understanding.  Pneumococcal vaccine status: Up to date  Covid-19 vaccine status: Completed vaccines  Qualifies for Shingles Vaccine? Yes   Zostavax completed Yes   Shingrix Completed?: Yes  Screening Tests Health Maintenance  Topic Date Due   Hepatitis C Screening  Never done   DTaP/Tdap/Td (3 - Tdap) 05/23/2015   COVID-19 Vaccine (3 -  Pfizer risk series) 03/22/2021   DEXA SCAN  08/21/2023 (Originally 08/04/2020)   COLONOSCOPY (Pts 45-80yrs Insurance coverage will need to be confirmed)  11/09/2022   INFLUENZA VACCINE  12/21/2022   Medicare Annual Wellness (AWV)  08/21/2023   MAMMOGRAM  01/19/2024   Pneumonia Vaccine 46+ Years old  Completed   Zoster Vaccines-  Shingrix  Completed   HPV VACCINES  Aged Out    Health Maintenance  Health Maintenance Due  Topic Date Due   Hepatitis C Screening  Never done   DTaP/Tdap/Td (3 - Tdap) 05/23/2015   COVID-19 Vaccine (3 - Pfizer risk series) 03/22/2021    Colorectal cancer screening: Type of screening: Colonoscopy. Completed 11/08/12. Repeat every 10 years  Mammogram status: Completed 01/18/22. Repeat every year     Additional Screening:  Hepatitis C Screening: does qualify;  Vision Screening: Recommended annual ophthalmology exams for early detection of glaucoma and other disorders of the eye. Is the patient up to date with their annual eye exam?  Yes  Who is the provider or what is the name of the office in which the patient attends annual eye exams? [facility]  If pt is not established with a provider, would they like to be referred to a provider to establish care? No .   Dental Screening: Recommended annual dental exams for proper oral hygiene  Community Resource Referral / Chronic Care Management: CRR required this visit?  No   CCM required this visit?  No      Plan:     I have personally reviewed and noted the following in the patient's chart:   Medical and social history Use of alcohol, tobacco or illicit drugs  Current medications and supplements including opioid prescriptions. Patient is not currently taking opioid prescriptions. Functional ability and status Nutritional status Physical activity Advanced directives List of other physicians Hospitalizations, surgeries, and ER visits in previous 12 months Vitals Screenings to include cognitive, depression, and falls Referrals and appointments  In addition, I have reviewed and discussed with patient certain preventive protocols, quality metrics, and best practice recommendations. A written personalized care plan for preventive services as well as general preventive health recommendations were provided to patient.      Willette Brace, LPN   [id number]   Nurse Notes: none

## 2022-08-21 NOTE — Patient Instructions (Signed)
Samantha Mayer , Thank you for taking time to come for your Medicare Wellness Visit. I appreciate your ongoing commitment to your health goals. Please review the following plan we discussed and let me know if I can assist you in the future.   These are the goals we discussed:  Goals      Patient Stated     Get back to routine exercsie        This is a list of the screening recommended for you and due dates:  Health Maintenance  Topic Date Due   Hepatitis C Screening: USPSTF Recommendation to screen - Ages 80-79 yo.  Never done   DTaP/Tdap/Td vaccine (3 - Tdap) 05/23/2015   COVID-19 Vaccine (3 - Pfizer risk series) 03/22/2021   DEXA scan (bone density measurement)  08/21/2023*   Colon Cancer Screening  11/09/2022   Flu Shot  12/21/2022   Medicare Annual Wellness Visit  08/21/2023   Mammogram  01/19/2024   Pneumonia Vaccine  Completed   Zoster (Shingles) Vaccine  Completed   HPV Vaccine  Aged Out  *Topic was postponed. The date shown is not the original due date.    Advanced directives: Please bring a copy of your health care power of attorney and living will to the office at your convenience.  Conditions/risks identified: get back to continued exercise   Next appointment: Follow up in one year for your annual wellness visit    Preventive Care 65 Years and Older, Female Preventive care refers to lifestyle choices and visits with your health care provider that can promote health and wellness. What does preventive care include? A yearly physical exam. This is also called an annual well check. Dental exams once or twice a year. Routine eye exams. Ask your health care provider how often you should have your eyes checked. Personal lifestyle choices, including: Daily care of your teeth and gums. Regular physical activity. Eating a healthy diet. Avoiding tobacco and drug use. Limiting alcohol use. Practicing safe sex. Taking low-dose aspirin every day. Taking vitamin and  mineral supplements as recommended by your health care provider. What happens during an annual well check? The services and screenings done by your health care provider during your annual well check will depend on your age, overall health, lifestyle risk factors, and family history of disease. Counseling  Your health care provider may ask you questions about your: Alcohol use. Tobacco use. Drug use. Emotional well-being. Home and relationship well-being. Sexual activity. Eating habits. History of falls. Memory and ability to understand (cognition). Work and work Statistician. Reproductive health. Screening  You may have the following tests or measurements: Height, weight, and BMI. Blood pressure. Lipid and cholesterol levels. These may be checked every 5 years, or more frequently if you are over 88 years old. Skin check. Lung cancer screening. You may have this screening every year starting at age 62 if you have a 30-pack-year history of smoking and currently smoke or have quit within the past 15 years. Fecal occult blood test (FOBT) of the stool. You may have this test every year starting at age 10. Flexible sigmoidoscopy or colonoscopy. You may have a sigmoidoscopy every 5 years or a colonoscopy every 10 years starting at age 56. Hepatitis C blood test. Hepatitis B blood test. Sexually transmitted disease (STD) testing. Diabetes screening. This is done by checking your blood sugar (glucose) after you have not eaten for a while (fasting). You may have this done every 1-3 years. Bone density scan. This is  done to screen for osteoporosis. You may have this done starting at age 51. Mammogram. This may be done every 1-2 years. Talk to your health care provider about how often you should have regular mammograms. Talk with your health care provider about your test results, treatment options, and if necessary, the need for more tests. Vaccines  Your health care provider may recommend  certain vaccines, such as: Influenza vaccine. This is recommended every year. Tetanus, diphtheria, and acellular pertussis (Tdap, Td) vaccine. You may need a Td booster every 10 years. Zoster vaccine. You may need this after age 33. Pneumococcal 13-valent conjugate (PCV13) vaccine. One dose is recommended after age 65. Pneumococcal polysaccharide (PPSV23) vaccine. One dose is recommended after age 13. Talk to your health care provider about which screenings and vaccines you need and how often you need them. This information is not intended to replace advice given to you by your health care provider. Make sure you discuss any questions you have with your health care provider. Document Released: 06/04/2015 Document Revised: 01/26/2016 Document Reviewed: 03/09/2015 Elsevier Interactive Patient Education  2017 [place] Prevention in the Home Falls can cause injuries. They can happen to people of all ages. There are many things you can do to make your home safe and to help prevent falls. What can I do on the outside of my home? Regularly fix the edges of walkways and driveways and fix any cracks. Remove anything that might make you trip as you walk through a door, such as a raised step or threshold. Trim any bushes or trees on the path to your home. Use bright outdoor lighting. Clear any walking paths of anything that might make someone trip, such as rocks or tools. Regularly check to see if handrails are loose or broken. Make sure that both sides of any steps have handrails. Any raised decks and porches should have guardrails on the edges. Have any leaves, snow, or ice cleared regularly. Use sand or salt on walking paths during winter. Clean up any spills in your garage right away. This includes oil or grease spills. What can I do in the bathroom? Use night lights. Install grab bars by the toilet and in the tub and shower. Do not use towel bars as grab bars. Use non-skid mats or  decals in the tub or shower. If you need to sit down in the shower, use a plastic, non-slip stool. Keep the floor dry. Clean up any water that spills on the floor as soon as it happens. Remove soap buildup in the tub or shower regularly. Attach bath mats securely with double-sided non-slip rug tape. Do not have throw rugs and other things on the floor that can make you trip. What can I do in the bedroom? Use night lights. Make sure that you have a light by your bed that is easy to reach. Do not use any sheets or blankets that are too big for your bed. They should not hang down onto the floor. Have a firm chair that has side arms. You can use this for support while you get dressed. Do not have throw rugs and other things on the floor that can make you trip. What can I do in the kitchen? Clean up any spills right away. Avoid walking on wet floors. Keep items that you use a lot in easy-to-reach places. If you need to reach something above you, use a strong step stool that has a grab bar. Keep electrical cords out of  the way. Do not use floor polish or wax that makes floors slippery. If you must use wax, use non-skid floor wax. Do not have throw rugs and other things on the floor that can make you trip. What can I do with my stairs? Do not leave any items on the stairs. Make sure that there are handrails on both sides of the stairs and use them. Fix handrails that are broken or loose. Make sure that handrails are as long as the stairways. Check any carpeting to make sure that it is firmly attached to the stairs. Fix any carpet that is loose or worn. Avoid having throw rugs at the top or bottom of the stairs. If you do have throw rugs, attach them to the floor with carpet tape. Make sure that you have a light switch at the top of the stairs and the bottom of the stairs. If you do not have them, ask someone to add them for you. What else can I do to help prevent falls? Wear shoes that: Do not  have high heels. Have rubber bottoms. Are comfortable and fit you well. Are closed at the toe. Do not wear sandals. If you use a stepladder: Make sure that it is fully opened. Do not climb a closed stepladder. Make sure that both sides of the stepladder are locked into place. Ask someone to hold it for you, if possible. Clearly mark and make sure that you can see: Any grab bars or handrails. First and last steps. Where the edge of each step is. Use tools that help you move around (mobility aids) if they are needed. These include: Canes. Walkers. Scooters. Crutches. Turn on the lights when you go into a dark area. Replace any light bulbs as soon as they burn out. Set up your furniture so you have a clear path. Avoid moving your furniture around. If any of your floors are uneven, fix them. If there are any pets around you, be aware of where they are. Review your medicines with your doctor. Some medicines can make you feel dizzy. This can increase your chance of falling. Ask your doctor what other things that you can do to help prevent falls. This information is not intended to replace advice given to you by your health care provider. Make sure you discuss any questions you have with your health care provider. Document Released: 03/04/2009 Document Revised: 10/14/2015 Document Reviewed: 06/12/2014 Elsevier Interactive Patient Education  2017 [employer].

## 2022-08-21 NOTE — Progress Notes (Signed)
Subjective:     Patient ID: Samantha Mayer, female    DOB: 26-Jun-1955, 67 y.o.   MRN: [id number]  Chief Complaint  Patient presents with   sore back    HPI-here w/husband.  Lives in [place] part time and here.  HLD-on Lipitor 20 mg.  OAB-on ditropan 10 mg Back pain-after packing for move and unpacking-getting pain upper back-saw doctor in [place].  If moves right arm across chest, pain. Celebrex and cyclobenzaprine and robaxin.  Still pain-across right back and under breast.  Did some physical therapy as well-but only 2 sessions.   GERD-doing well on omeprazole-no dysphagia Osteoarthritis-takes Voltaren gel daily.   Vitamin D deficiency-taking 25k/week(s) for 4 months.  Needs levels checked.  Health Maintenance Due  Topic Date Due   Hepatitis C Screening  Never done   DTaP/Tdap/Td (3 - Tdap) 05/23/2015   COVID-19 Vaccine (3 - Pfizer risk series) 03/22/2021    Past Medical History:  Diagnosis Date   Allergy    Arthritis    Depression    Hearing loss    Hyperlipidemia    borderline   Nipple discharge    bloody in lft   Papilloma of breast    lft   Rosacea    Seasonal allergies    Urgency incontinence     Past Surgical History:  Procedure Laterality Date   ABDOMINAL HYSTERECTOMY  1994   Leiomyomas, partial   ARCUATE KERATECTOMY  1987   BREAST DUCTAL SYSTEM EXCISION  10/27/2011   Procedure: EXCISION DUCTAL SYSTEM BREAST;  Surgeon: Haywood Lasso, MD;  Location: [facility];  Service: General;  Laterality: Left;   BREAST EXCISIONAL BIOPSY Left    benign   breast papilloma excision  2006   left   EYE SURGERY     karatotomy   TONSILLECTOMY  1963    Outpatient Medications Prior to Visit  Medication Sig Dispense Refill   atorvastatin (LIPITOR) 20 MG tablet Take 1 tablet (20 mg total) by mouth daily. 90 tablet 3   omeprazole (PRILOSEC OTC) 20 MG tablet Take 20 mg by mouth daily.     oxybutynin (DITROPAN-XL) 10 MG 24 hr tablet Take 10 mg by mouth  daily.     atorvastatin (LIPITOR) 10 MG tablet TAKE 1 TABLET BY MOUTH EVERY DAY (Patient not taking: Reported on 08/21/2022) 30 tablet 11   omeprazole (PRILOSEC) 20 MG capsule Take 1 capsule by mouth daily. (Patient not taking: Reported on 08/21/2022)     No facility-administered medications prior to visit.    Allergies  Allergen Reactions   Codeine     As child Other reaction(s): Other (See Comments) As child Mouth turned blue almost passed out    Sulfa Antibiotics    Sulfamethoxazole Swelling   Sulfonamide Derivatives     REACTION: rash, swelling   ROS neg/noncontributory except as noted HPI/below Tested for asthma due to cough-no asthma or epoch      Objective:     BP 112/71 (BP Location: Left Arm, Patient Position: Sitting)   Pulse 71   Temp 98.2 F (36.8 C) (Temporal)   Ht 5\' 7"  (1.702 m)   Wt 201 lb 6.4 oz (91.4 kg)   SpO2 97%   BMI 31.54 kg/m  Wt Readings from Last 3 Encounters:  08/21/22 201 lb (91.2 kg)  08/21/22 201 lb 6.4 oz (91.4 kg)  12/27/21 198 lb 4 oz (89.9 kg)    Physical Exam   Gen: WDWN NAD HEENT: NCAT, conjunctiva  not injected, sclera nonicteric NECK:  supple, no thyromegaly, no nodes, no carotid bruits CARDIAC: RRR, S1S2+, no murmur. DP 2+B LUNGS: CTAB. No wheezes ABDOMEN:  BS+, soft, NTND, No HSM, no masses EXT:  no edema MSK: no TTP spine.  Some pain back when reaching across chest w/right arm.  Good ROM shoulder and strength.  Some TTP and muscle spasm right mid back to ant chest-just below breast band.  NEURO: A&O x3.  CN II-XII intact.  PSYCH: normal mood. Good eye contact     Assessment & Plan:   Problem List Items Addressed This Visit       Digestive   Gastroesophageal reflux disease without esophagitis   Relevant Medications   omeprazole (PRILOSEC OTC) 20 MG tablet     Musculoskeletal and Integument   Primary osteoarthritis involving multiple joints     Other   Urinary urgency   Pure hypercholesterolemia - Primary    Relevant Medications   atorvastatin (LIPITOR) 20 MG tablet   Other Relevant Orders   Comprehensive metabolic panel   Vitamin D deficiency   Relevant Orders   VITAMIN D 25 Hydroxy (Vit-D Deficiency, Fractures)   Upper back pain on right side  1.  Hyperlipidemia-chronic.  Well-controlled on Lipitor 20 mg daily.  Continue.  Check CMP 2.  Overactive bladder-chronic.  Controlled on Ditropan XL 10 mg daily.  Renewed 3.  Upper back pain-right sided-started with overuse when packing and unpacking.  Has had 2 massages.  Saw Dr. Mickie Hillier pain.  Advise she can continue Celebrex daily as needed.  Muscle relaxers that she has if she would like.  Stretches, rolling, massage.  She will only be in town for the rest of the week.  Then she is going to [place], then back to [place].  Advised to follow-up with them if needed. 4.  GERD-chronic.  Controlled on omeprazole 20 mg daily. 5.  Vitamin D deficiency-on vitamin D.  Check vitamin D level 6.  Osteoarthritis-multiple sites requesting diclofenac gel 4 times daily as needed  Follow-up annual physical in 6 months.  She sees our LPN for Medicare physical  Meds ordered this encounter  Medications   diclofenac Sodium (VOLTAREN) 1 % GEL    Sig: Apply 4 g topically 4 (four) times daily as needed.    Dispense:  300 g    Refill:  3   oxybutynin (DITROPAN-XL) 10 MG 24 hr tablet    Sig: Take 1 tablet (10 mg total) by mouth daily.    Dispense:  90 tablet    Refill:  3   atorvastatin (LIPITOR) 20 MG tablet    Sig: Take 1 tablet (20 mg total) by mouth daily.    Dispense:  90 tablet    Refill:  3   omeprazole (PRILOSEC OTC) 20 MG tablet    Sig: Take 1 tablet (20 mg total) by mouth daily.    Dispense:  90 tablet    Refill:  3    Wellington Hampshire, MD

## 2022-08-22 LAB — COMPREHENSIVE METABOLIC PANEL
ALT: 24 U/L (ref 0–35)
AST: 22 U/L (ref 0–37)
Albumin: 4.3 g/dL (ref 3.5–5.2)
Alkaline Phosphatase: 111 U/L (ref 39–117)
BUN: 17 mg/dL (ref 6–23)
CO2: 28 mEq/L (ref 19–32)
Calcium: 9.8 mg/dL (ref 8.4–10.5)
Chloride: 105 mEq/L (ref 96–112)
Creatinine, Ser: 0.81 mg/dL (ref 0.40–1.20)
GFR: 75.31 mL/min (ref >60.00)
Glucose, Bld: 96 mg/dL (ref 70–99)
Potassium: 3.8 mEq/L (ref 3.5–5.1)
Sodium: 139 mEq/L (ref 135–145)
Total Bilirubin: 1.1 mg/dL (ref 0.2–1.2)
Total Protein: 6.9 g/dL (ref 6.0–8.3)

## 2022-08-22 LAB — VITAMIN D 25 HYDROXY (VIT D DEFICIENCY, FRACTURES): VITD: 30.12 ng/mL (ref 30.00–100.00)

## 2022-08-23 NOTE — Progress Notes (Signed)
Kidney function liver function look great.  Vitamin D level is just to the bottom end of normal.  Recommend continuing the weekly vitamin D for at least another month and then can try doing 2000 IUs/day over-the-counter

## 2022-08-23 NOTE — Progress Notes (Signed)
Subjective:   Samantha Mayer is a 67 y.o. female who presents for an Initial Medicare Annual Wellness Visit.  Review of Systems     Cardiac Risk Factors include: advanced age (>91men, >13 women);obesity (BMI >30kg/m2);dyslipidemia     Objective:    Today's Vitals   08/21/22 1531  BP: 112/71  Weight: 201 lb (91.2 kg)   Body mass index is 31.48 kg/m.     08/21/2022    3:34 PM 10/25/2011   11:17 AM 09/12/2011    3:49 PM  Advanced Directives  Does Patient Have a Medical Advance Directive? Yes Patient does not have advance directive Patient does not have advance directive  Type of Advance Directive [place];Living will    Copy of [place] in Chart? No - copy requested      Current Medications (verified) Outpatient Encounter Medications as of 08/21/2022  Medication Sig   atorvastatin (LIPITOR) 20 MG tablet Take 1 tablet (20 mg total) by mouth daily.   diclofenac Sodium (VOLTAREN) 1 % GEL Apply 4 g topically 4 (four) times daily as needed.   omeprazole (PRILOSEC OTC) 20 MG tablet Take 1 tablet (20 mg total) by mouth daily.   oxybutynin (DITROPAN-XL) 10 MG 24 hr tablet Take 1 tablet (10 mg total) by mouth daily.   No facility-administered encounter medications on file as of 08/21/2022.    Allergies (verified) Codeine, Sulfa antibiotics, Sulfamethoxazole, and Sulfonamide derivatives   History: Past Medical History:  Diagnosis Date   Allergy    Arthritis    Depression    Hearing loss    Hyperlipidemia    borderline   Nipple discharge    bloody in lft   Papilloma of breast    lft   Rosacea    Seasonal allergies    Urgency incontinence    Past Surgical History:  Procedure Laterality Date   ABDOMINAL HYSTERECTOMY  1994   Leiomyomas, partial   ARCUATE KERATECTOMY  1987   BREAST DUCTAL SYSTEM EXCISION  10/27/2011   Procedure: EXCISION DUCTAL SYSTEM BREAST;  Surgeon: Haywood Lasso, MD;  Location: [facility];   Service: General;  Laterality: Left;   BREAST EXCISIONAL BIOPSY Left    benign   breast papilloma excision  2006   left   EYE SURGERY     karatotomy   TONSILLECTOMY  1963   Family History  Problem Relation Age of Onset   Hearing loss Mother    Arthritis Mother    Hypertension Mother    COPD Father    Heart disease Father 52   AAA (abdominal aortic aneurysm) Father    Heart attack Father    Heart disease Brother 44       CAD   Colon cancer Neg Hx    Social History   Socioeconomic History   Marital status: Married    Spouse name: Not on file   Number of children: 3   Years of education: Not on file   Highest education level: Not on file  Occupational History   Not on file  Tobacco Use   Smoking status: Former    Packs/day: 1.00    Years: 12.00    Additional pack years: 0.00    Total pack years: 12.00    Types: Cigarettes    Quit date: 09/12/1991    Years since quitting: 30.9   Smokeless tobacco: Never  Vaping Use   Vaping Use: Never used  Substance and Sexual Activity  Alcohol use: Yes    Alcohol/week: 2.0 - 3.0 standard drinks of alcohol    Types: 1 - 2 Glasses of wine, 1 Shots of liquor per week    Comment: scotch   Drug use: Never   Sexual activity: Yes    Birth control/protection: Surgical  Other Topics Concern   Not on file  Social History Narrative   [phone]      Retired Pharmacist, hospital   Social Determinants of Health   Financial Resource Strain: [place]  (08/21/2022)   Overall Financial Resource Strain (CARDIA)    Difficulty of Paying Living Expenses: Not hard at all  Food Insecurity: No Food Insecurity (08/21/2022)   Hunger Vital Sign    Worried About Running Out of Food in the Last Year: Never true    [place] in the Last Year: Never true  Transportation Needs: No Transportation Needs (08/21/2022)   PRAPARE - Hydrologist (Medical): No    Lack of Transportation (Non-Medical): No  Physical Activity: Inactive  (08/21/2022)   Exercise Vital Sign    Days of Exercise per Week: 0 days    Minutes of Exercise per Session: 0 min  Stress: No Stress Concern Present (08/21/2022)   [place]    Feeling of Stress : Not at all  Social Connections: Moderately Integrated (08/21/2022)   Social Connection and Isolation Panel [NHANES]    Frequency of Communication with Friends and Family: More than three times a week    Frequency of Social Gatherings with Friends and Family: More than three times a week    Attends Religious Services: More than 4 times per year    Active Member of [employer] or Organizations: No    Attends Music therapist: Never    Marital Status: Married    Tobacco Counseling Counseling given: Not Answered   Clinical Intake:  Pre-visit preparation completed: Yes  Pain : No/denies pain     BMI - recorded: 31.48 Nutritional Status: BMI > 30  Obese Nutritional Risks: None Diabetes: No  How often do you need to have someone help you when you read instructions, pamphlets, or other written materials from your doctor or pharmacy?: 1 - Never  Diabetic?no  Interpreter Needed?: No  Information entered by :: Charlott Rakes, LPN   Activities of Daily Living    08/21/2022    3:35 PM  In your present state of health, do you have any difficulty performing the following activities:  Hearing? 1  Comment wears hearing issues  Vision? 0  Difficulty concentrating or making decisions? 0  Walking or climbing stairs? 0  Dressing or bathing? 0  Doing errands, shopping? 0  Preparing Food and eating ? N  Using the Toilet? N  In the past six months, have you accidently leaked urine? N  Do you have problems with loss of bowel control? N  Managing your Medications? N  Managing your Finances? N  Housekeeping or managing your Housekeeping? N    Patient Care Team: Tawnya Crook, MD as PCP - General (Family  Medicine)  Indicate any recent Medical Services you may have received from other than [facility] providers in the past year (date may be approximate).     Assessment:   This is a routine wellness examination for [facility].  Hearing/Vision screen Hearing Screening - Comments:: Wears hearing aids  Vision Screening - Comments:: Pt follows up with [facility]   Dietary  issues and exercise activities discussed: Current Exercise Habits: The patient does not participate in regular exercise at present   Goals Addressed             This Visit's Progress    Patient Stated       Get back to routine exercsie      Depression Screen    08/21/2022    3:33 PM 12/27/2021   11:21 AM  PHQ 2/9 Scores  PHQ - 2 Score 0 0    Fall Risk    08/21/2022    3:35 PM 08/21/2022    1:44 PM 12/27/2021   11:21 AM  [place] in the past year? 0 0 0  Number falls in past yr: 0 0 0  Injury with Fall? 0 0 0  Risk for fall due to : Impaired vision No Fall Risks No Fall Risks  Follow up Falls prevention discussed Falls evaluation completed Falls evaluation completed    [place]:  Any stairs in or around the home? No  If so, are there any without handrails? No  Home free of loose throw rugs in walkways, pet beds, electrical cords, etc? Yes  Adequate lighting in your home to reduce risk of falls? Yes   ASSISTIVE DEVICES UTILIZED TO PREVENT FALLS:  Life alert? No  Use of a cane, walker or w/c? No  Grab bars in the bathroom? Yes  Shower chair or bench in shower? Yes  Elevated toilet seat or a handicapped toilet? No   TIMED UP AND GO:  Was the test performed? Yes .  Length of time to ambulate 10 feet: 10 sec.   Gait steady and fast without use of assistive device  Cognitive Function:        08/21/2022    3:35 PM  6CIT Screen  What Year? 0 points  What month? 0 points  What time? 0 points  Count back from 20 0 points  Months in reverse 0 points  Repeat  phrase 0 points  Total Score 0 points    Immunizations Immunization History  Administered Date(s) Administered   Influenza Split 02/22/2011   Influenza, High Dose Seasonal PF 04/26/2021   Influenza, Seasonal, Injecte, Preservative Fre 03/08/2018, 04/02/2019   Influenza,inj,Quad PF,6+ Mos 03/27/2016, 07/08/2018   PFIZER Comirnaty(Gray Top)Covid-19 Tri-Sucrose Vaccine 10/26/2020   PNEUMOCOCCAL CONJUGATE-20 04/26/2021   Pfizer Covid-19 Vaccine Bivalent Booster 87yrs & up 02/22/2021   Td 05/22/2005   Td (Adult),5 Lf Tetanus Toxid, Preservative Free 05/22/2005   Zoster Recombinat (Shingrix) 10/03/2017, 04/08/2018    TDAP status: Due, Education has been provided regarding the importance of this vaccine. Advised may receive this vaccine at local pharmacy or Health Dept. Aware to provide a copy of the vaccination record if obtained from local pharmacy or Health Dept. Verbalized acceptance and understanding.  Flu Vaccine status: Declined, Education has been provided regarding the importance of this vaccine but patient still declined. Advised may receive this vaccine at local pharmacy or Health Dept. Aware to provide a copy of the vaccination record if obtained from local pharmacy or Health Dept. Verbalized acceptance and understanding.  Pneumococcal vaccine status: Up to date  Covid-19 vaccine status: Completed vaccines  Qualifies for Shingles Vaccine? Yes   Zostavax completed Yes   Shingrix Completed?: Yes  Screening Tests Health Maintenance  Topic Date Due   Hepatitis C Screening  Never done   DTaP/Tdap/Td (3 - Tdap) 05/23/2015   COVID-19 Vaccine (3 -  Pfizer risk series) 03/22/2021   DEXA SCAN  08/21/2023 (Originally 08/04/2020)   COLONOSCOPY (Pts 45-74yrs Insurance coverage will need to be confirmed)  11/09/2022   INFLUENZA VACCINE  12/21/2022   Medicare Annual Wellness (AWV)  08/21/2023   MAMMOGRAM  01/19/2024   Pneumonia Vaccine 59+ Years old  Completed   Zoster Vaccines-  Shingrix  Completed   HPV VACCINES  Aged Out    Health Maintenance  Health Maintenance Due  Topic Date Due   Hepatitis C Screening  Never done   DTaP/Tdap/Td (3 - Tdap) 05/23/2015   COVID-19 Vaccine (3 - Pfizer risk series) 03/22/2021    Colorectal cancer screening: Type of screening: Colonoscopy. Completed 11/08/12. Repeat every 10 years  Mammogram status: Completed 01/18/22. Repeat every year     Additional Screening:  Hepatitis C Screening: does qualify;  Vision Screening: Recommended annual ophthalmology exams for early detection of glaucoma and other disorders of the eye. Is the patient up to date with their annual eye exam?  Yes  Who is the provider or what is the name of the office in which the patient attends annual eye exams? [facility]  If pt is not established with a provider, would they like to be referred to a provider to establish care? No .   Dental Screening: Recommended annual dental exams for proper oral hygiene  Community Resource Referral / Chronic Care Management: CRR required this visit?  No   CCM required this visit?  No      Plan:     I have personally reviewed and noted the following in the patient's chart:   Medical and social history Use of alcohol, tobacco or illicit drugs  Current medications and supplements including opioid prescriptions. Patient is not currently taking opioid prescriptions. Functional ability and status Nutritional status Physical activity Advanced directives List of other physicians Hospitalizations, surgeries, and ER visits in previous 12 months Vitals Screenings to include cognitive, depression, and falls Referrals and appointments  In addition, I have reviewed and discussed with patient certain preventive protocols, quality metrics, and best practice recommendations. A written personalized care plan for preventive services as well as general preventive health recommendations were provided to patient.      Willette Brace, LPN   [id number]   Nurse Notes: none

## 2022-08-24 ENCOUNTER — Telehealth: Payer: Self-pay | Admitting: Family Medicine

## 2022-08-24 ENCOUNTER — Other Ambulatory Visit: Payer: Self-pay | Admitting: *Deleted

## 2022-08-24 DIAGNOSIS — E7800 Pure hypercholesterolemia, unspecified: Secondary | ICD-10-CM

## 2022-08-24 NOTE — Telephone Encounter (Signed)
Pt states she needs lab orders for cholesterin and sugar.

## 2022-08-24 NOTE — Telephone Encounter (Signed)
Left message to return call to office.

## 2022-08-24 NOTE — Telephone Encounter (Signed)
Yes, would like A1C and cholesterol.

## 2022-08-24 NOTE — Telephone Encounter (Signed)
Orders placed. Scheduled lab appt.

## 2022-08-24 NOTE — Telephone Encounter (Signed)
Pt states she needs lab orders for cholesterol and sugar.

## 2022-08-25 ENCOUNTER — Other Ambulatory Visit (INDEPENDENT_AMBULATORY_CARE_PROVIDER_SITE_OTHER): Payer: Medicare PPO

## 2022-08-25 DIAGNOSIS — E7800 Pure hypercholesterolemia, unspecified: Secondary | ICD-10-CM | POA: Diagnosis not present

## 2022-08-25 LAB — LIPID PANEL
Cholesterol: 170 mg/dL (ref 0–200)
HDL: 47.1 mg/dL (ref >39.00)
LDL Cholesterol: 105 mg/dL — ABNORMAL HIGH (ref 0–99)
NonHDL: 122.67
Total CHOL/HDL Ratio: 4
Triglycerides: 89 mg/dL (ref 0.0–149.0)
VLDL: 17.8 mg/dL (ref 0.0–40.0)

## 2022-08-25 LAB — HEMOGLOBIN A1C: Hgb A1c MFr Bld: 5.8 % (ref 4.6–6.5)

## 2023-03-30 ENCOUNTER — Encounter: Payer: Self-pay | Admitting: Internal Medicine

## 2023-06-08 ENCOUNTER — Ambulatory Visit: Payer: Medicare PPO | Admitting: Family Medicine

## 2023-06-08 ENCOUNTER — Encounter: Payer: Self-pay | Admitting: Family Medicine

## 2023-06-08 VITALS — BP 108/60 | HR 80 | Temp 97.7°F | Resp 18 | Ht 67.0 in | Wt 171.4 lb

## 2023-06-08 DIAGNOSIS — H1033 Unspecified acute conjunctivitis, bilateral: Secondary | ICD-10-CM

## 2023-06-08 MED ORDER — CIPROFLOXACIN HCL 0.3 % OP SOLN: 2.0000 [drp] | Freq: Four times a day (QID) | OPHTHALMIC | 0 refills | Status: DC

## 2023-06-08 NOTE — Patient Instructions (Signed)
Eye drops both eyes.  Soaks.  Don't wear contacts

## 2023-06-08 NOTE — Progress Notes (Signed)
Subjective:     Patient ID: Samantha Mayer, female    DOB: February 26, 1956, 68 y.o.   MRN: [id number]  Chief Complaint  Patient presents with   Eye Problem    Eye irritation, possible infection that started 3 days ago Eye drainage, painful and redness    HPI L eye for 3 days-felt like something got in eye and then rubbed eye.  Using artificial tear. Itchy and "heavy", redness, green d/c.  Now starting in R eye as well. Occ wears contact lenses.  Some runny nose for l3 wks and cough/phlegm green for 3 wks. Went to UC.  Told viral.   Getting better.  Cough worse at night.   Has lost 30# in 6 mo w/rybelsys  Health Maintenance Due  Topic Date Due   Hepatitis C Screening  Never done   Colonoscopy  11/09/2022    Past Medical History:  Diagnosis Date   Allergy    Arthritis    Depression    Hearing loss    Hyperlipidemia    borderline   Nipple discharge    bloody in lft   Papilloma of breast    lft   Rosacea    Seasonal allergies    Urgency incontinence     Past Surgical History:  Procedure Laterality Date   ABDOMINAL HYSTERECTOMY  1994   Leiomyomas, partial   ARCUATE KERATECTOMY  1987   BREAST DUCTAL SYSTEM EXCISION  10/27/2011   Procedure: EXCISION DUCTAL SYSTEM BREAST;  Surgeon: Currie Paris, MD;  Location: [facility];  Service: General;  Laterality: Left;   BREAST EXCISIONAL BIOPSY Left    benign   breast papilloma excision  2006   left   EYE SURGERY     karatotomy   TONSILLECTOMY  1963     Current Outpatient Medications:    atorvastatin (LIPITOR) 20 MG tablet, Take 1 tablet (20 mg total) by mouth daily., Disp: 90 tablet, Rfl: 3   ciprofloxacin (CILOXAN) 0.3 % ophthalmic solution, Place 2 drops into both eyes in the morning, at noon, in the evening, and at bedtime. Administer 1 drop, every 2 hours, while awake, for 2 days. Then 1 drop, every 4 hours, while awake, for the next 5 days., Disp: 5 mL, Rfl: 0   diclofenac Sodium (VOLTAREN) 1 %  GEL, Apply 4 g topically 4 (four) times daily as needed., Disp: 300 g, Rfl: 3   oxybutynin (DITROPAN-XL) 10 MG 24 hr tablet, Take 1 tablet (10 mg total) by mouth daily., Disp: 90 tablet, Rfl: 3   Semaglutide 7 MG TABS, Take 1 tablet by mouth daily., Disp: , Rfl:    omeprazole (PRILOSEC OTC) 20 MG tablet, Take 1 tablet (20 mg total) by mouth daily. (Patient not taking: Reported on 06/08/2023), Disp: 90 tablet, Rfl: 3  Allergies  Allergen Reactions   Codeine     As child Other reaction(s): Other (See Comments) As child Mouth turned blue almost passed out    Sulfa Antibiotics    Sulfamethoxazole Swelling   Sulfonamide Derivatives     REACTION: rash, swelling   ROS neg/noncontributory except as noted HPI/below      Objective:     BP 108/60   Pulse 80   Temp 97.7 F (36.5 C) (Temporal)   Resp 18   Ht 5\' 7"  (1.702 m)   Wt 171 lb 6 oz (77.7 kg)   SpO2 98%   BMI 26.84 kg/m  Wt Readings from Last 3 Encounters:  06/08/23  171 lb 6 oz (77.7 kg)  08/21/22 201 lb (91.2 kg)  08/21/22 201 lb 6.4 oz (91.4 kg)    Physical Exam   Gen: WDWN NAD HEENT: NCAT, conjunctiva +injected L>R and yellow d/c.  No TTP.  eomi, sclera nonicteric TM WNL B, OP moist, no exudates  NECK:  supple, no thyromegaly, no nodes, no carotid bruits CARDIAC: RRR, S1S2+, no murmur.  LUNGS: CTAB. No wheezes MSK: no gross abnormalities.  NEURO: A&O x3.  CN II-XII intact.  PSYCH: normal mood. Good eye contact     Assessment & Plan:  Acute bacterial conjunctivitis of both eyes  Other orders -     Ciprofloxacin HCl; Place 2 drops into both eyes in the morning, at noon, in the evening, and at bedtime. Administer 1 drop, every 2 hours, while awake, for 2 days. Then 1 drop, every 4 hours, while awake, for the next 5 days.  Dispense: 5 mL; Refill: 0  Cipro ophth drops  Return for as sch on Monday.  Angelena Sole, MD

## 2023-06-09 DIAGNOSIS — H1033 Unspecified acute conjunctivitis, bilateral: Secondary | ICD-10-CM | POA: Diagnosis not present

## 2023-06-11 ENCOUNTER — Encounter: Payer: Self-pay | Admitting: Family Medicine

## 2023-06-11 ENCOUNTER — Ambulatory Visit (INDEPENDENT_AMBULATORY_CARE_PROVIDER_SITE_OTHER): Payer: Medicare PPO | Admitting: Family Medicine

## 2023-06-11 ENCOUNTER — Telehealth: Payer: Self-pay | Admitting: Family Medicine

## 2023-06-11 VITALS — BP 127/78 | HR 72 | Temp 97.1°F | Resp 18 | Ht 67.0 in | Wt 168.1 lb

## 2023-06-11 DIAGNOSIS — R7303 Prediabetes: Secondary | ICD-10-CM | POA: Diagnosis not present

## 2023-06-11 DIAGNOSIS — Z1159 Encounter for screening for other viral diseases: Secondary | ICD-10-CM

## 2023-06-11 DIAGNOSIS — Z0000 Encounter for general adult medical examination without abnormal findings: Secondary | ICD-10-CM

## 2023-06-11 DIAGNOSIS — E7800 Pure hypercholesterolemia, unspecified: Secondary | ICD-10-CM | POA: Diagnosis not present

## 2023-06-11 LAB — CBC WITH DIFFERENTIAL/PLATELET
Basophils Absolute: 0 10*3/uL (ref 0.0–0.1)
Basophils Relative: 0.3 % (ref 0.0–3.0)
Eosinophils Absolute: 0.1 10*3/uL (ref 0.0–0.7)
Eosinophils Relative: 1.4 % (ref 0.0–5.0)
HCT: 41.2 % (ref 36.0–46.0)
Hemoglobin: 13.2 g/dL (ref 12.0–15.0)
Lymphocytes Relative: 31.6 % (ref 12.0–46.0)
Lymphs Abs: 1.9 10*3/uL (ref 0.7–4.0)
MCHC: 32 g/dL (ref 30.0–36.0)
MCV: 90.8 fL (ref 78.0–100.0)
Monocytes Absolute: 0.4 10*3/uL (ref 0.1–1.0)
Monocytes Relative: 6.1 % (ref 3.0–12.0)
Neutro Abs: 3.6 10*3/uL (ref 1.4–7.7)
Neutrophils Relative %: 60.6 % (ref 43.0–77.0)
Platelets: 246 10*3/uL (ref 150.0–400.0)
RBC: 4.54 Mil/uL (ref 3.87–5.11)
RDW: 13.2 % (ref 11.5–15.5)
WBC: 5.9 10*3/uL (ref 4.0–10.5)

## 2023-06-11 LAB — COMPREHENSIVE METABOLIC PANEL
ALT: 15 U/L (ref 0–35)
AST: 19 U/L (ref 0–37)
Albumin: 4 g/dL (ref 3.5–5.2)
Alkaline Phosphatase: 110 U/L (ref 39–117)
BUN: 10 mg/dL (ref 6–23)
CO2: 28 meq/L (ref 19–32)
Calcium: 9.3 mg/dL (ref 8.4–10.5)
Chloride: 105 meq/L (ref 96–112)
Creatinine, Ser: 0.73 mg/dL (ref 0.40–1.20)
GFR: 84.84 mL/min (ref >60.00)
Glucose, Bld: 78 mg/dL (ref 70–99)
Potassium: 3.6 meq/L (ref 3.5–5.1)
Sodium: 142 meq/L (ref 135–145)
Total Bilirubin: 1 mg/dL (ref 0.2–1.2)
Total Protein: 6.8 g/dL (ref 6.0–8.3)

## 2023-06-11 LAB — LIPID PANEL
Cholesterol: 157 mg/dL (ref 0–200)
HDL: 47.1 mg/dL (ref >39.00)
LDL Cholesterol: 93 mg/dL (ref 0–99)
NonHDL: 109.76
Total CHOL/HDL Ratio: 3
Triglycerides: 85 mg/dL (ref 0.0–149.0)
VLDL: 17 mg/dL (ref 0.0–40.0)

## 2023-06-11 LAB — TSH: TSH: 1.79 u[IU]/mL (ref 0.35–5.50)

## 2023-06-11 LAB — HEMOGLOBIN A1C: Hgb A1c MFr Bld: 5.6 % (ref 4.6–6.5)

## 2023-06-11 MED ORDER — OXYBUTYNIN CHLORIDE ER 10 MG PO TB24: 10.0000 mg | ORAL_TABLET | Freq: Every day | ORAL | 3 refills | Status: DC

## 2023-06-11 MED ORDER — ATORVASTATIN CALCIUM 20 MG PO TABS: 20.0000 mg | ORAL_TABLET | Freq: Every day | ORAL | 3 refills | Status: DC

## 2023-06-11 NOTE — Assessment & Plan Note (Signed)
Chronic.  Working on diet-taking rybelsys 7mg  daily(from [place]).  Encouraged exercise

## 2023-06-11 NOTE — Patient Instructions (Signed)

## 2023-06-11 NOTE — Progress Notes (Signed)
Phone [phone]   Subjective:   Patient is a 68 y.o. female presenting for annual physical.    Chief Complaint  Patient presents with   Annual Exam    CPE Not fasting   Annual-not exercising.  Cscope will be done in [place] Wt-on rybelsis-doing well.  Has lost 30# HLD-on lipitor 20mg  daily.  No SE  See problem oriented charting- ROS- ROS: Gen: no fever, chills  Skin: no rash, itching ENT: no ear pain, ear drainage, nasal congestion, rhinorrhea, sinus pressure, sore throat Eyes: no blurry vision, double vision Resp: no cough, wheeze,SOB CV: no CP, palpitations, LE edema,  GI: no heartburn, n/v/d/c, abd pain.  Some rectal bleeding-saw doc in [place]-hemorrhoid.  Some fecal soiling.  GU: no dysuria, urgency, frequency, hematuria MSK: no joint pain, myalgias, back pain Neuro: no dizziness, headache, weakness, vertigo Psych: no depression, anxiety, insomnia, SI   The following were reviewed and entered/updated in epic: Past Medical History:  Diagnosis Date   Allergy    Arthritis    Depression    Hearing loss    Hyperlipidemia    borderline   Nipple discharge    bloody in lft   Papilloma of breast    lft   Rosacea    Seasonal allergies    Urgency incontinence    Patient Active Problem List   Diagnosis Date Noted   Prediabetes 06/11/2023   Primary osteoarthritis involving multiple joints 08/21/2022   Vitamin D deficiency 08/21/2022   Upper back pain on right side 08/21/2022   Gastroesophageal reflux disease without esophagitis 08/21/2022   Pure hypercholesterolemia 12/27/2021   Rosacea 12/27/2021   Other and unspecified hyperlipidemia 10/21/2013   Urinary urgency 10/21/2013   Intraductal papilloma of left  breast 01/11/2011   Past Surgical History:  Procedure Laterality Date   ABDOMINAL HYSTERECTOMY  1994   Leiomyomas, partial   ARCUATE KERATECTOMY  1987   BREAST DUCTAL SYSTEM EXCISION  10/27/2011   Procedure: EXCISION DUCTAL SYSTEM BREAST;  Surgeon:  Currie Paris, MD;  Location: [facility];  Service: General;  Laterality: Left;   BREAST EXCISIONAL BIOPSY Left    benign   breast papilloma excision  2006   left   EYE SURGERY     karatotomy   TONSILLECTOMY  1963    Family History  Problem Relation Age of Onset   Hearing loss Mother    Arthritis Mother    Hypertension Mother    COPD Father    Heart disease Father 63   AAA (abdominal aortic aneurysm) Father    Heart attack Father    Heart disease Brother 34       CAD   Colon cancer Neg Hx     Medications- reviewed and updated Current Outpatient Medications  Medication Sig Dispense Refill   diclofenac Sodium (VOLTAREN) 1 % GEL Apply 4 g topically 4 (four) times daily as needed. 300 g 3   Semaglutide 7 MG TABS Take 1 tablet by mouth daily.     tobramycin-dexamethasone (TOBRADEX) ophthalmic solution Apply to eye.     atorvastatin (LIPITOR) 20 MG tablet Take 1 tablet (20 mg total) by mouth daily. 90 tablet 3   oxybutynin (DITROPAN-XL) 10 MG 24 hr tablet Take 1 tablet (10 mg total) by mouth daily. 90 tablet 3   No current facility-administered medications for this visit.    Allergies-reviewed and updated Allergies  Allergen Reactions   Codeine     As child Other reaction(s): Other (See Comments) As child  Mouth turned blue almost passed out    Sulfa Antibiotics    Sulfamethoxazole Swelling   Sulfonamide Derivatives     REACTION: rash, swelling    Social History   Social History Narrative   [phone]      Retired Runner, broadcasting/film/video   Objective  Objective:  BP 127/78   Pulse 72   Temp (!) 97.1 F (36.2 C) (Temporal)   Resp 18   Ht 5\' 7"  (1.702 m)   Wt 168 lb 2 oz (76.3 kg)   SpO2 98%   BMI 26.33 kg/m  Physical Exam  Gen: WDWN NAD HEENT: NCAT, conjunctiva not injected, sclera nonicteric TM WNL B, OP moist, no exudates  NECK:  supple, no thyromegaly, no nodes, no carotid bruits CARDIAC: RRR, S1S2+, no murmur. DP 2+B LUNGS: CTAB. No  wheezes ABDOMEN:  BS+, soft, NTND, No HSM, no masses EXT:  no edema MSK: no gross abnormalities. MS 5/5 all 4 NEURO: A&O x3.  CN II-XII intact.  PSYCH: normal mood. Good eye contact     Assessment and Plan   Health Maintenance counseling: 1. Anticipatory guidance: Patient counseled regarding regular dental exams q6 months, eye exams,  avoiding smoking and second hand smoke, limiting alcohol to 1 beverage per day, no illicit drugs.   2. Risk factor reduction:  Advised patient of need for regular exercise and diet rich and fruits and vegetables to reduce risk of heart attack and stroke. Exercise- encouraged.  Wt Readings from Last 3 Encounters:  06/11/23 168 lb 2 oz (76.3 kg)  06/08/23 171 lb 6 oz (77.7 kg)  08/21/22 201 lb (91.2 kg)   3. Immunizations/screenings/ancillary studies Immunization History  Administered Date(s) Administered   Influenza Split 02/22/2011   Influenza, High Dose Seasonal PF 04/26/2021   Influenza, Seasonal, Injecte, Preservative Fre 03/08/2018, 04/02/2019   Influenza,inj,Quad PF,6+ Mos 03/27/2016, 07/08/2018   MMR 09/04/2006   PFIZER Comirnaty(Gray Top)Covid-19 Tri-Sucrose Vaccine 10/26/2020   PFIZER(Purple Top)SARS-COV-2 Vaccination 07/20/2019, 08/17/2019, 03/04/2020   PNEUMOCOCCAL CONJUGATE-20 04/26/2021   Pfizer Covid-19 Vaccine Bivalent Booster 52yrs & up 02/22/2021   Td 05/22/2005   Td (Adult),5 Lf Tetanus Toxid, Preservative Free 05/22/2005   Zoster Recombinant(Shingrix) 10/03/2017, 04/08/2018   Health Maintenance Due  Topic Date Due   Hepatitis C Screening  Never done    4. Cervical cancer screening- utd 5. Breast cancer screening-  mammogram utd 6. Colon cancer screening - will sch in [place] 7. Skin cancer screening- advised regular sunscreen use. Denies worrisome, changing, or new skin lesions.  8. Birth control/STD check- n/a 9. Osteoporosis screening- utd 10. Smoking associated screening - non smoker  Wellness examination  Pure  hypercholesterolemia Assessment & Plan: Chronic.  Controlled.  Continue lipitor 20mg  daily  Orders: -     CBC with Differential/Platelet -     Comprehensive metabolic panel -     Lipid panel -     TSH -     Atorvastatin Calcium; Take 1 tablet (20 mg total) by mouth daily.  Dispense: 90 tablet; Refill: 3  Prediabetes Assessment & Plan: Chronic.  Working on diet-taking rybelsys 7mg  daily(from [place]).  Encouraged exercise  Orders: -     Hemoglobin A1c  Screening for viral disease -     Hepatitis C antibody  Other orders -     oxyBUTYnin Chloride ER; Take 1 tablet (10 mg total) by mouth daily.  Dispense: 90 tablet; Refill: 3   Annual-antic guidance.    Recommended follow up: Return in about 1 year (around 06/10/2024)  for annual physical.  Lab/Order associations:non fasting-crackers, fruit, eggs, yogurt.  Angelena Sole, MD

## 2023-06-11 NOTE — Progress Notes (Signed)
Labs look great.  Continue same meds.

## 2023-06-11 NOTE — Telephone Encounter (Signed)
Copied from CRM [id number]. Topic: Clinical - Prescription Issue >> Jun 08, 2023  4:39 PM Florestine Avers wrote: Reason for CRM: Pharmacist Huston Foley called in because he needs clarifications on ciprofloxacin (CILOXAN) 0.3 % ophthalmic solution does not understand directions of the eye drops. Pharmacist requesting a call back [phone].

## 2023-06-11 NOTE — Assessment & Plan Note (Signed)
 Chronic.  Controlled.  Continue lipitor 20mg  daily

## 2023-06-12 LAB — HEPATITIS C ANTIBODY: Hepatitis C Ab: NONREACTIVE

## 2023-11-08 ENCOUNTER — Encounter: Payer: Self-pay | Admitting: Pharmacist

## 2023-11-08 NOTE — Progress Notes (Signed)
 Pharmacy Quality Measure Review  This patient is appearing on a report for being at risk of failing the adherence measure for cholesterol (statin) medications this calendar year.   Medication: rosuvastatin  Last fill date: 06/11/2023 for 90 day supply  Spoke with patient's daughter. She reports that patient is not living in the [place]  currently. She comes to see PCP once a year.  Daughter reports Mrs. Bielak is taking atorvastatin  but has been getting at her pharmacy in [place]   Ger Ringenberg, PharmD Clinical Pharmacist [facility]  Population Health [phone]

## 2024-04-22 ENCOUNTER — Telehealth: Payer: Self-pay | Admitting: Family Medicine

## 2024-04-22 ENCOUNTER — Telehealth: Payer: Self-pay

## 2024-04-22 ENCOUNTER — Other Ambulatory Visit: Payer: Self-pay

## 2024-04-22 DIAGNOSIS — E7800 Pure hypercholesterolemia, unspecified: Secondary | ICD-10-CM

## 2024-04-22 MED ORDER — ATORVASTATIN CALCIUM 20 MG PO TABS: 20.0000 mg | ORAL_TABLET | Freq: Every day | ORAL | 3 refills | Status: AC

## 2024-04-22 MED ORDER — OXYBUTYNIN CHLORIDE ER 10 MG PO TB24: 10.0000 mg | ORAL_TABLET | Freq: Every day | ORAL | 3 refills | Status: AC

## 2024-04-22 MED ORDER — DICLOFENAC SODIUM 1 % EX GEL: 4.0000 g | Freq: Four times a day (QID) | CUTANEOUS | 3 refills | Status: AC | PRN

## 2024-04-22 NOTE — Telephone Encounter (Unsigned)
 Copied from CRM #[id number]. Topic: Clinical - Medication Refill >> Apr 22, 2024  3:04 PM Ashley R wrote: Medication: atorvastatin  (LIPITOR) 20 MG tablet diclofenac  Sodium (VOLTAREN ) 1 % GEL oxybutynin  (DITROPAN -XL) 10 MG 24 hr tablet     Has the patient contacted their pharmacy? Yes  This is the patient's preferred pharmacy:  [id number] [place], [place] - [address] [place] [address] Phone: [phone] Fax: [phone]  Is this the correct pharmacy for this prescription? Yes If no, delete pharmacy and type the correct one.   Has the prescription been filled recently? Yes  Is the patient out of the medication? No  Has the patient been seen for an appointment in the last year OR does the patient have an upcoming appointment? Yes  Can we respond through MyChart? [place], [place]  Agent: Please be advised that Rx refills may take up to 3 business days. We ask that you follow-up with your pharmacy.

## 2024-04-22 NOTE — Telephone Encounter (Signed)
 Copied from CRM #[id number]. Topic: Clinical - Medication Refill >> Apr 22, 2024  3:04 PM Ashley R wrote: Medication: atorvastatin  (LIPITOR) 20 MG tablet diclofenac  Sodium (VOLTAREN ) 1 % GEL oxybutynin  (DITROPAN -XL) 10 MG 24 hr tablet     Has the patient contacted their pharmacy? Yes  This is the patient's preferred pharmacy:  [id number] [place], [place] - [address] [place] [address] Phone: [phone] Fax: [phone]  Is this the correct pharmacy for this prescription? Yes If no, delete pharmacy and type the correct one.   Has the prescription been filled recently? Yes  Is the patient out of the medication? No  Has the patient been seen for an appointment in the last year OR does the patient have an upcoming appointment? Yes  Can we respond through MyChart? [place], [place]  Agent: Please be advised that Rx refills may take up to 3 business days. We ask that you follow-up with your pharmacy.

## 2024-07-10 ENCOUNTER — Encounter: Payer: Medicare PPO | Admitting: Family Medicine
